# Patient Record
Sex: Female | Born: 1935 | Race: White | Hispanic: No | Marital: Married | Smoking: Never smoker
Health system: Southern US, Community
[De-identification: ages and names within clinical notes are randomized; demographics above are authoritative.]

## PROBLEM LIST (undated history)

## (undated) DIAGNOSIS — M810 Age-related osteoporosis without current pathological fracture: Secondary | ICD-10-CM

## (undated) DIAGNOSIS — M069 Rheumatoid arthritis, unspecified: Secondary | ICD-10-CM

## (undated) DIAGNOSIS — E538 Deficiency of other specified B group vitamins: Secondary | ICD-10-CM

## (undated) DIAGNOSIS — E079 Disorder of thyroid, unspecified: Secondary | ICD-10-CM

## (undated) DIAGNOSIS — F0390 Unspecified dementia without behavioral disturbance: Secondary | ICD-10-CM

## (undated) DIAGNOSIS — I10 Essential (primary) hypertension: Secondary | ICD-10-CM

## (undated) DIAGNOSIS — R7303 Prediabetes: Secondary | ICD-10-CM

## (undated) DIAGNOSIS — E039 Hypothyroidism, unspecified: Secondary | ICD-10-CM

## (undated) DIAGNOSIS — F419 Anxiety disorder, unspecified: Secondary | ICD-10-CM

## (undated) DIAGNOSIS — M1990 Unspecified osteoarthritis, unspecified site: Secondary | ICD-10-CM

---

## 1999-10-25 ENCOUNTER — Encounter: Admission: RE | Admit: 1999-10-25 | Discharge: 1999-10-25 | Payer: Self-pay | Admitting: *Deleted

## 1999-10-25 ENCOUNTER — Encounter: Payer: Self-pay | Admitting: *Deleted

## 1999-11-05 ENCOUNTER — Encounter: Payer: Self-pay | Admitting: *Deleted

## 1999-11-05 ENCOUNTER — Encounter: Admission: RE | Admit: 1999-11-05 | Discharge: 1999-11-05 | Payer: Self-pay | Admitting: *Deleted

## 2000-11-05 ENCOUNTER — Encounter: Admission: RE | Admit: 2000-11-05 | Discharge: 2000-11-05 | Payer: Self-pay | Admitting: Internal Medicine

## 2000-11-05 ENCOUNTER — Encounter: Payer: Self-pay | Admitting: Internal Medicine

## 2001-11-06 ENCOUNTER — Encounter: Payer: Self-pay | Admitting: Internal Medicine

## 2001-11-06 ENCOUNTER — Encounter: Admission: RE | Admit: 2001-11-06 | Discharge: 2001-11-06 | Payer: Self-pay | Admitting: Internal Medicine

## 2002-11-08 ENCOUNTER — Encounter: Payer: Self-pay | Admitting: Internal Medicine

## 2002-11-08 ENCOUNTER — Encounter: Admission: RE | Admit: 2002-11-08 | Discharge: 2002-11-08 | Payer: Self-pay | Admitting: Internal Medicine

## 2003-11-14 ENCOUNTER — Encounter: Admission: RE | Admit: 2003-11-14 | Discharge: 2003-11-14 | Payer: Self-pay | Admitting: Internal Medicine

## 2003-12-12 ENCOUNTER — Ambulatory Visit (HOSPITAL_COMMUNITY): Admission: RE | Admit: 2003-12-12 | Discharge: 2003-12-12 | Payer: Self-pay | Admitting: Gastroenterology

## 2004-11-16 ENCOUNTER — Encounter: Admission: RE | Admit: 2004-11-16 | Discharge: 2004-11-16 | Payer: Self-pay | Admitting: Obstetrics and Gynecology

## 2005-12-06 ENCOUNTER — Encounter: Admission: RE | Admit: 2005-12-06 | Discharge: 2005-12-06 | Payer: Self-pay | Admitting: Internal Medicine

## 2006-11-03 ENCOUNTER — Emergency Department: Payer: Self-pay | Admitting: Emergency Medicine

## 2006-11-16 ENCOUNTER — Other Ambulatory Visit: Payer: Self-pay

## 2006-11-16 ENCOUNTER — Emergency Department: Payer: Self-pay | Admitting: Emergency Medicine

## 2006-12-29 ENCOUNTER — Encounter: Admission: RE | Admit: 2006-12-29 | Discharge: 2006-12-29 | Payer: Self-pay | Admitting: Family Medicine

## 2008-01-19 ENCOUNTER — Encounter: Admission: RE | Admit: 2008-01-19 | Discharge: 2008-01-19 | Payer: Self-pay | Admitting: Family Medicine

## 2009-03-15 ENCOUNTER — Encounter: Admission: RE | Admit: 2009-03-15 | Discharge: 2009-03-15 | Payer: Self-pay | Admitting: Family Medicine

## 2010-03-16 ENCOUNTER — Encounter: Admission: RE | Admit: 2010-03-16 | Discharge: 2010-03-16 | Payer: Self-pay | Admitting: Family Medicine

## 2010-06-25 ENCOUNTER — Encounter: Payer: Self-pay | Admitting: Family Medicine

## 2010-10-19 NOTE — Op Note (Signed)
[name], Dana Carter                          ACCOUNT NO.:  [id number]   MEDICAL RECORD NO.:  [id number]                   PATIENT TYPE:  AMB   LOCATION:  ENDO                                 FACILITY:  [facility]   PHYSICIAN:  Danise Edge, M.D.                DATE OF BIRTH:  Sep 24, 1925   DATE OF PROCEDURE:  12/12/2003  DATE OF DISCHARGE:                                 OPERATIVE REPORT   PROCEDURE:  Screening colonoscopy.   INDICATIONS FOR PROCEDURE:  [name] is a 77 year old female born  04/02/1926.  [name] is scheduled to undergo her first screening  colon with polypectomy to prevent colon cancer.   ENDOSCOPIST:  Danise Edge, M.D.   PREMEDICATION:  Versed 5 mg, Demerol 50 mg.   DESCRIPTION OF PROCEDURE:  After obtaining informed consent, [name] was  placed in the left lateral decubitus position. I administered intravenous  Demerol and intravenous Versed to achieve conscious sedation for the  procedure. The patient's blood pressure, oxygen saturation and cardiac  rhythm were monitored throughout the procedure and documented in the medical  record.   Anal inspection and digital rectal exam were normal. The Olympus adjustable  pediatric colonoscope was introduced into the rectum and advanced to the  cecum. Colonic preparation for the exam today was excellent.   RECTUM:  Normal.   SIGMOID COLON AND DESCENDING COLON:  Left colonic diverticulosis.   SPLENIC FLEXURE:  Normal.   TRANSVERSE COLON:  Normal.   HEPATIC FLEXURE:  Normal.   ASCENDING COLON:  Normal.   CECUM AND ILEOCECAL VALVE:  Normal.   ASSESSMENT:  Normal screening proctocolonoscopy to the cecum.  No endoscopic  evidence for the presence of colorectal neoplasia.                                               Danise Edge, M.D.    MJ/MEDQ  D:  12/12/2003  T:  12/12/2003  Job:  [id number]   cc:   Ike Bene, M.D.  [address]  [place]  [place] [address]  Fax:  [phone]

## 2011-03-04 ENCOUNTER — Other Ambulatory Visit: Payer: Self-pay | Admitting: Family Medicine

## 2011-03-04 DIAGNOSIS — Z1231 Encounter for screening mammogram for malignant neoplasm of breast: Secondary | ICD-10-CM

## 2011-03-18 ENCOUNTER — Ambulatory Visit
Admission: RE | Admit: 2011-03-18 | Discharge: 2011-03-18 | Disposition: A | Discharge status: Home / Self Care | Payer: No Typology Code available for payment source | Source: Ambulatory Visit | Attending: Family Medicine | Admitting: Family Medicine

## 2011-03-18 DIAGNOSIS — Z1231 Encounter for screening mammogram for malignant neoplasm of breast: Secondary | ICD-10-CM

## 2011-06-13 DIAGNOSIS — Z23 Encounter for immunization: Secondary | ICD-10-CM | POA: Diagnosis not present

## 2011-06-24 DIAGNOSIS — E039 Hypothyroidism, unspecified: Secondary | ICD-10-CM | POA: Diagnosis not present

## 2011-06-24 DIAGNOSIS — Z79899 Other long term (current) drug therapy: Secondary | ICD-10-CM | POA: Diagnosis not present

## 2011-06-24 DIAGNOSIS — E7800 Pure hypercholesterolemia, unspecified: Secondary | ICD-10-CM | POA: Diagnosis not present

## 2011-06-25 DIAGNOSIS — H04129 Dry eye syndrome of unspecified lacrimal gland: Secondary | ICD-10-CM | POA: Diagnosis not present

## 2011-07-02 DIAGNOSIS — R413 Other amnesia: Secondary | ICD-10-CM | POA: Diagnosis not present

## 2011-07-02 DIAGNOSIS — R443 Hallucinations, unspecified: Secondary | ICD-10-CM | POA: Diagnosis not present

## 2011-07-02 DIAGNOSIS — E039 Hypothyroidism, unspecified: Secondary | ICD-10-CM | POA: Diagnosis not present

## 2011-07-04 DIAGNOSIS — D485 Neoplasm of uncertain behavior of skin: Secondary | ICD-10-CM | POA: Diagnosis not present

## 2011-07-09 DIAGNOSIS — R443 Hallucinations, unspecified: Secondary | ICD-10-CM | POA: Diagnosis not present

## 2011-07-09 DIAGNOSIS — R413 Other amnesia: Secondary | ICD-10-CM | POA: Diagnosis not present

## 2011-07-18 DIAGNOSIS — H6500 Acute serous otitis media, unspecified ear: Secondary | ICD-10-CM | POA: Diagnosis not present

## 2011-07-23 DIAGNOSIS — Z79899 Other long term (current) drug therapy: Secondary | ICD-10-CM | POA: Diagnosis not present

## 2011-07-30 DIAGNOSIS — R413 Other amnesia: Secondary | ICD-10-CM | POA: Diagnosis not present

## 2011-07-30 DIAGNOSIS — M069 Rheumatoid arthritis, unspecified: Secondary | ICD-10-CM | POA: Diagnosis not present

## 2011-07-30 DIAGNOSIS — M810 Age-related osteoporosis without current pathological fracture: Secondary | ICD-10-CM | POA: Diagnosis not present

## 2011-07-30 DIAGNOSIS — N3943 Post-void dribbling: Secondary | ICD-10-CM | POA: Diagnosis not present

## 2011-08-06 DIAGNOSIS — M810 Age-related osteoporosis without current pathological fracture: Secondary | ICD-10-CM | POA: Diagnosis not present

## 2011-08-27 DIAGNOSIS — F0390 Unspecified dementia without behavioral disturbance: Secondary | ICD-10-CM | POA: Diagnosis not present

## 2011-09-03 DIAGNOSIS — M810 Age-related osteoporosis without current pathological fracture: Secondary | ICD-10-CM | POA: Diagnosis not present

## 2011-09-09 DIAGNOSIS — M810 Age-related osteoporosis without current pathological fracture: Secondary | ICD-10-CM | POA: Diagnosis not present

## 2011-10-03 DIAGNOSIS — R300 Dysuria: Secondary | ICD-10-CM | POA: Diagnosis not present

## 2011-10-09 DIAGNOSIS — N8111 Cystocele, midline: Secondary | ICD-10-CM | POA: Diagnosis not present

## 2011-10-09 DIAGNOSIS — R339 Retention of urine, unspecified: Secondary | ICD-10-CM | POA: Diagnosis not present

## 2011-10-12 ENCOUNTER — Emergency Department: Payer: Self-pay | Admitting: Emergency Medicine

## 2011-10-12 DIAGNOSIS — N8111 Cystocele, midline: Secondary | ICD-10-CM | POA: Diagnosis not present

## 2011-10-12 DIAGNOSIS — R6889 Other general symptoms and signs: Secondary | ICD-10-CM | POA: Diagnosis not present

## 2011-10-12 DIAGNOSIS — Z79899 Other long term (current) drug therapy: Secondary | ICD-10-CM | POA: Diagnosis not present

## 2011-10-12 LAB — URINALYSIS, COMPLETE
Bilirubin,UR: NEGATIVE
Blood: NEGATIVE
Ketone: NEGATIVE
Protein: NEGATIVE
RBC,UR: 1 /HPF (ref 0–5)
Specific Gravity: 1.009 (ref 1.003–1.030)
Squamous Epithelial: 6
Transitional Epi: <1

## 2011-10-17 DIAGNOSIS — N952 Postmenopausal atrophic vaginitis: Secondary | ICD-10-CM | POA: Diagnosis not present

## 2011-10-17 DIAGNOSIS — R339 Retention of urine, unspecified: Secondary | ICD-10-CM | POA: Diagnosis not present

## 2011-10-17 DIAGNOSIS — N8111 Cystocele, midline: Secondary | ICD-10-CM | POA: Diagnosis not present

## 2011-10-24 DIAGNOSIS — N8111 Cystocele, midline: Secondary | ICD-10-CM | POA: Diagnosis not present

## 2011-10-24 DIAGNOSIS — R339 Retention of urine, unspecified: Secondary | ICD-10-CM | POA: Diagnosis not present

## 2011-10-29 DIAGNOSIS — Z79899 Other long term (current) drug therapy: Secondary | ICD-10-CM | POA: Diagnosis not present

## 2011-10-29 DIAGNOSIS — M069 Rheumatoid arthritis, unspecified: Secondary | ICD-10-CM | POA: Diagnosis not present

## 2011-11-05 DIAGNOSIS — R339 Retention of urine, unspecified: Secondary | ICD-10-CM | POA: Diagnosis not present

## 2011-11-05 DIAGNOSIS — N8110 Cystocele, unspecified: Secondary | ICD-10-CM | POA: Diagnosis not present

## 2011-11-21 DIAGNOSIS — M546 Pain in thoracic spine: Secondary | ICD-10-CM | POA: Diagnosis not present

## 2011-11-21 DIAGNOSIS — M549 Dorsalgia, unspecified: Secondary | ICD-10-CM | POA: Diagnosis not present

## 2011-12-10 DIAGNOSIS — Z961 Presence of intraocular lens: Secondary | ICD-10-CM | POA: Diagnosis not present

## 2011-12-11 DIAGNOSIS — J069 Acute upper respiratory infection, unspecified: Secondary | ICD-10-CM | POA: Diagnosis not present

## 2012-01-07 DIAGNOSIS — E7800 Pure hypercholesterolemia, unspecified: Secondary | ICD-10-CM | POA: Diagnosis not present

## 2012-01-07 DIAGNOSIS — E538 Deficiency of other specified B group vitamins: Secondary | ICD-10-CM | POA: Diagnosis not present

## 2012-01-07 DIAGNOSIS — E039 Hypothyroidism, unspecified: Secondary | ICD-10-CM | POA: Diagnosis not present

## 2012-01-09 DIAGNOSIS — M549 Dorsalgia, unspecified: Secondary | ICD-10-CM | POA: Diagnosis not present

## 2012-01-09 DIAGNOSIS — N8110 Cystocele, unspecified: Secondary | ICD-10-CM | POA: Diagnosis not present

## 2012-01-10 DIAGNOSIS — E039 Hypothyroidism, unspecified: Secondary | ICD-10-CM | POA: Diagnosis not present

## 2012-01-10 DIAGNOSIS — I10 Essential (primary) hypertension: Secondary | ICD-10-CM | POA: Diagnosis not present

## 2012-01-10 DIAGNOSIS — R7309 Other abnormal glucose: Secondary | ICD-10-CM | POA: Diagnosis not present

## 2012-01-10 DIAGNOSIS — E7800 Pure hypercholesterolemia, unspecified: Secondary | ICD-10-CM | POA: Diagnosis not present

## 2012-01-21 ENCOUNTER — Ambulatory Visit: Payer: Self-pay | Admitting: Obstetrics and Gynecology

## 2012-01-21 DIAGNOSIS — Z79899 Other long term (current) drug therapy: Secondary | ICD-10-CM | POA: Diagnosis not present

## 2012-01-21 DIAGNOSIS — M069 Rheumatoid arthritis, unspecified: Secondary | ICD-10-CM | POA: Diagnosis not present

## 2012-01-21 DIAGNOSIS — M549 Dorsalgia, unspecified: Secondary | ICD-10-CM | POA: Diagnosis not present

## 2012-01-21 DIAGNOSIS — M5137 Other intervertebral disc degeneration, lumbosacral region: Secondary | ICD-10-CM | POA: Diagnosis not present

## 2012-01-21 DIAGNOSIS — M51379 Other intervertebral disc degeneration, lumbosacral region without mention of lumbar back pain or lower extremity pain: Secondary | ICD-10-CM | POA: Diagnosis not present

## 2012-01-24 DIAGNOSIS — E871 Hypo-osmolality and hyponatremia: Secondary | ICD-10-CM | POA: Diagnosis not present

## 2012-01-28 DIAGNOSIS — M810 Age-related osteoporosis without current pathological fracture: Secondary | ICD-10-CM | POA: Diagnosis not present

## 2012-01-28 DIAGNOSIS — M546 Pain in thoracic spine: Secondary | ICD-10-CM | POA: Diagnosis not present

## 2012-01-28 DIAGNOSIS — M069 Rheumatoid arthritis, unspecified: Secondary | ICD-10-CM | POA: Diagnosis not present

## 2012-02-28 DIAGNOSIS — E871 Hypo-osmolality and hyponatremia: Secondary | ICD-10-CM | POA: Diagnosis not present

## 2012-03-16 DIAGNOSIS — M810 Age-related osteoporosis without current pathological fracture: Secondary | ICD-10-CM | POA: Diagnosis not present

## 2012-03-23 DIAGNOSIS — J309 Allergic rhinitis, unspecified: Secondary | ICD-10-CM | POA: Diagnosis not present

## 2012-03-30 DIAGNOSIS — E871 Hypo-osmolality and hyponatremia: Secondary | ICD-10-CM | POA: Diagnosis not present

## 2012-03-30 DIAGNOSIS — R42 Dizziness and giddiness: Secondary | ICD-10-CM | POA: Diagnosis not present

## 2012-04-03 DIAGNOSIS — R42 Dizziness and giddiness: Secondary | ICD-10-CM | POA: Diagnosis not present

## 2012-04-03 DIAGNOSIS — R209 Unspecified disturbances of skin sensation: Secondary | ICD-10-CM | POA: Diagnosis not present

## 2012-04-21 DIAGNOSIS — M069 Rheumatoid arthritis, unspecified: Secondary | ICD-10-CM | POA: Diagnosis not present

## 2012-04-21 DIAGNOSIS — Z79899 Other long term (current) drug therapy: Secondary | ICD-10-CM | POA: Diagnosis not present

## 2012-06-19 DIAGNOSIS — F411 Generalized anxiety disorder: Secondary | ICD-10-CM | POA: Diagnosis not present

## 2012-06-19 DIAGNOSIS — F0390 Unspecified dementia without behavioral disturbance: Secondary | ICD-10-CM | POA: Diagnosis not present

## 2012-06-19 DIAGNOSIS — E039 Hypothyroidism, unspecified: Secondary | ICD-10-CM | POA: Diagnosis not present

## 2012-06-19 DIAGNOSIS — R634 Abnormal weight loss: Secondary | ICD-10-CM | POA: Diagnosis not present

## 2012-07-15 DIAGNOSIS — E7800 Pure hypercholesterolemia, unspecified: Secondary | ICD-10-CM | POA: Diagnosis not present

## 2012-07-15 DIAGNOSIS — E039 Hypothyroidism, unspecified: Secondary | ICD-10-CM | POA: Diagnosis not present

## 2012-07-15 DIAGNOSIS — E538 Deficiency of other specified B group vitamins: Secondary | ICD-10-CM | POA: Diagnosis not present

## 2012-07-21 DIAGNOSIS — F411 Generalized anxiety disorder: Secondary | ICD-10-CM | POA: Diagnosis not present

## 2012-07-22 DIAGNOSIS — Z79899 Other long term (current) drug therapy: Secondary | ICD-10-CM | POA: Diagnosis not present

## 2012-07-22 DIAGNOSIS — M069 Rheumatoid arthritis, unspecified: Secondary | ICD-10-CM | POA: Diagnosis not present

## 2012-07-29 DIAGNOSIS — M069 Rheumatoid arthritis, unspecified: Secondary | ICD-10-CM | POA: Diagnosis not present

## 2012-08-18 DIAGNOSIS — E7800 Pure hypercholesterolemia, unspecified: Secondary | ICD-10-CM | POA: Diagnosis not present

## 2012-08-18 DIAGNOSIS — F411 Generalized anxiety disorder: Secondary | ICD-10-CM | POA: Diagnosis not present

## 2012-08-18 DIAGNOSIS — J309 Allergic rhinitis, unspecified: Secondary | ICD-10-CM | POA: Diagnosis not present

## 2012-08-18 DIAGNOSIS — IMO0002 Reserved for concepts with insufficient information to code with codable children: Secondary | ICD-10-CM | POA: Diagnosis not present

## 2012-09-01 DIAGNOSIS — IMO0002 Reserved for concepts with insufficient information to code with codable children: Secondary | ICD-10-CM | POA: Diagnosis not present

## 2012-09-01 DIAGNOSIS — L570 Actinic keratosis: Secondary | ICD-10-CM | POA: Diagnosis not present

## 2012-09-01 DIAGNOSIS — D485 Neoplasm of uncertain behavior of skin: Secondary | ICD-10-CM | POA: Diagnosis not present

## 2012-09-16 DIAGNOSIS — M810 Age-related osteoporosis without current pathological fracture: Secondary | ICD-10-CM | POA: Diagnosis not present

## 2012-10-28 DIAGNOSIS — M069 Rheumatoid arthritis, unspecified: Secondary | ICD-10-CM | POA: Diagnosis not present

## 2012-10-30 DIAGNOSIS — H6120 Impacted cerumen, unspecified ear: Secondary | ICD-10-CM | POA: Diagnosis not present

## 2012-10-30 DIAGNOSIS — R42 Dizziness and giddiness: Secondary | ICD-10-CM | POA: Diagnosis not present

## 2012-11-30 DIAGNOSIS — S41109A Unspecified open wound of unspecified upper arm, initial encounter: Secondary | ICD-10-CM | POA: Diagnosis not present

## 2012-12-03 DIAGNOSIS — S41109A Unspecified open wound of unspecified upper arm, initial encounter: Secondary | ICD-10-CM | POA: Diagnosis not present

## 2012-12-21 ENCOUNTER — Emergency Department: Payer: Self-pay | Admitting: Emergency Medicine

## 2012-12-21 DIAGNOSIS — R5381 Other malaise: Secondary | ICD-10-CM | POA: Diagnosis not present

## 2012-12-21 DIAGNOSIS — Z79899 Other long term (current) drug therapy: Secondary | ICD-10-CM | POA: Diagnosis not present

## 2012-12-21 DIAGNOSIS — R42 Dizziness and giddiness: Secondary | ICD-10-CM | POA: Diagnosis not present

## 2012-12-21 DIAGNOSIS — I6350 Cerebral infarction due to unspecified occlusion or stenosis of unspecified cerebral artery: Secondary | ICD-10-CM | POA: Diagnosis not present

## 2012-12-21 DIAGNOSIS — R5383 Other fatigue: Secondary | ICD-10-CM | POA: Diagnosis not present

## 2012-12-21 DIAGNOSIS — I2510 Atherosclerotic heart disease of native coronary artery without angina pectoris: Secondary | ICD-10-CM | POA: Diagnosis not present

## 2012-12-21 DIAGNOSIS — R6889 Other general symptoms and signs: Secondary | ICD-10-CM | POA: Diagnosis not present

## 2012-12-21 DIAGNOSIS — I10 Essential (primary) hypertension: Secondary | ICD-10-CM | POA: Diagnosis not present

## 2012-12-21 LAB — COMPREHENSIVE METABOLIC PANEL
Albumin: 3.2 g/dL — ABNORMAL LOW (ref 3.4–5.0)
Alkaline Phosphatase: 59 U/L (ref 50–136)
BUN: 12 mg/dL (ref 7–18)
Chloride: 103 mmol/L (ref 98–107)
Co2: 28 mmol/L (ref 21–32)
Creatinine: 0.59 mg/dL — ABNORMAL LOW (ref 0.60–1.30)
EGFR (African American): >60
EGFR (Non-African Amer.): >60
Sodium: 135 mmol/L — ABNORMAL LOW (ref 136–145)

## 2012-12-21 LAB — URINALYSIS, COMPLETE
Bilirubin,UR: NEGATIVE
Glucose,UR: NEGATIVE mg/dL (ref 0–75)
Leukocyte Esterase: NEGATIVE
Protein: NEGATIVE
RBC,UR: <1 /HPF (ref 0–5)
Specific Gravity: 1.018 (ref 1.003–1.030)

## 2012-12-21 LAB — CBC
HGB: 12.2 g/dL (ref 12.0–16.0)
MCV: 101 fL — ABNORMAL HIGH (ref 80–100)
Platelet: 184 10*3/uL (ref 150–440)
RDW: 14.4 % (ref 11.5–14.5)

## 2012-12-24 DIAGNOSIS — R42 Dizziness and giddiness: Secondary | ICD-10-CM | POA: Diagnosis not present

## 2012-12-24 DIAGNOSIS — F411 Generalized anxiety disorder: Secondary | ICD-10-CM | POA: Diagnosis not present

## 2012-12-24 DIAGNOSIS — S41109A Unspecified open wound of unspecified upper arm, initial encounter: Secondary | ICD-10-CM | POA: Diagnosis not present

## 2012-12-24 DIAGNOSIS — I10 Essential (primary) hypertension: Secondary | ICD-10-CM | POA: Diagnosis not present

## 2013-01-04 DIAGNOSIS — M069 Rheumatoid arthritis, unspecified: Secondary | ICD-10-CM | POA: Diagnosis not present

## 2013-01-11 DIAGNOSIS — M810 Age-related osteoporosis without current pathological fracture: Secondary | ICD-10-CM | POA: Diagnosis not present

## 2013-01-11 DIAGNOSIS — M069 Rheumatoid arthritis, unspecified: Secondary | ICD-10-CM | POA: Diagnosis not present

## 2013-01-18 DIAGNOSIS — R7309 Other abnormal glucose: Secondary | ICD-10-CM | POA: Diagnosis not present

## 2013-01-18 DIAGNOSIS — E039 Hypothyroidism, unspecified: Secondary | ICD-10-CM | POA: Diagnosis not present

## 2013-01-18 DIAGNOSIS — E7800 Pure hypercholesterolemia, unspecified: Secondary | ICD-10-CM | POA: Diagnosis not present

## 2013-01-18 DIAGNOSIS — E538 Deficiency of other specified B group vitamins: Secondary | ICD-10-CM | POA: Diagnosis not present

## 2013-01-20 DIAGNOSIS — E039 Hypothyroidism, unspecified: Secondary | ICD-10-CM | POA: Diagnosis not present

## 2013-01-20 DIAGNOSIS — R7309 Other abnormal glucose: Secondary | ICD-10-CM | POA: Diagnosis not present

## 2013-01-20 DIAGNOSIS — E7800 Pure hypercholesterolemia, unspecified: Secondary | ICD-10-CM | POA: Diagnosis not present

## 2013-01-20 DIAGNOSIS — E538 Deficiency of other specified B group vitamins: Secondary | ICD-10-CM | POA: Diagnosis not present

## 2013-02-10 DIAGNOSIS — D0460 Carcinoma in situ of skin of unspecified upper limb, including shoulder: Secondary | ICD-10-CM | POA: Diagnosis not present

## 2013-02-10 DIAGNOSIS — C44211 Basal cell carcinoma of skin of unspecified ear and external auricular canal: Secondary | ICD-10-CM | POA: Diagnosis not present

## 2013-02-10 DIAGNOSIS — D485 Neoplasm of uncertain behavior of skin: Secondary | ICD-10-CM | POA: Diagnosis not present

## 2013-02-23 DIAGNOSIS — C44621 Squamous cell carcinoma of skin of unspecified upper limb, including shoulder: Secondary | ICD-10-CM | POA: Diagnosis not present

## 2013-02-23 DIAGNOSIS — L905 Scar conditions and fibrosis of skin: Secondary | ICD-10-CM | POA: Diagnosis not present

## 2013-03-29 DIAGNOSIS — M069 Rheumatoid arthritis, unspecified: Secondary | ICD-10-CM | POA: Diagnosis not present

## 2013-03-29 DIAGNOSIS — Z79899 Other long term (current) drug therapy: Secondary | ICD-10-CM | POA: Diagnosis not present

## 2013-04-15 DIAGNOSIS — M810 Age-related osteoporosis without current pathological fracture: Secondary | ICD-10-CM | POA: Diagnosis not present

## 2013-04-20 DIAGNOSIS — L820 Inflamed seborrheic keratosis: Secondary | ICD-10-CM | POA: Diagnosis not present

## 2013-04-20 DIAGNOSIS — L578 Other skin changes due to chronic exposure to nonionizing radiation: Secondary | ICD-10-CM | POA: Diagnosis not present

## 2013-04-20 DIAGNOSIS — L570 Actinic keratosis: Secondary | ICD-10-CM | POA: Diagnosis not present

## 2013-04-20 DIAGNOSIS — D485 Neoplasm of uncertain behavior of skin: Secondary | ICD-10-CM | POA: Diagnosis not present

## 2013-04-20 DIAGNOSIS — L821 Other seborrheic keratosis: Secondary | ICD-10-CM | POA: Diagnosis not present

## 2013-04-20 DIAGNOSIS — C44721 Squamous cell carcinoma of skin of unspecified lower limb, including hip: Secondary | ICD-10-CM | POA: Diagnosis not present

## 2013-05-11 DIAGNOSIS — C44621 Squamous cell carcinoma of skin of unspecified upper limb, including shoulder: Secondary | ICD-10-CM | POA: Diagnosis not present

## 2013-05-11 DIAGNOSIS — C4492 Squamous cell carcinoma of skin, unspecified: Secondary | ICD-10-CM | POA: Diagnosis not present

## 2013-05-11 DIAGNOSIS — C44711 Basal cell carcinoma of skin of unspecified lower limb, including hip: Secondary | ICD-10-CM | POA: Diagnosis not present

## 2013-06-29 DIAGNOSIS — M069 Rheumatoid arthritis, unspecified: Secondary | ICD-10-CM | POA: Diagnosis not present

## 2013-06-29 DIAGNOSIS — Z79899 Other long term (current) drug therapy: Secondary | ICD-10-CM | POA: Diagnosis not present

## 2013-07-04 ENCOUNTER — Emergency Department: Payer: Self-pay | Admitting: Emergency Medicine

## 2013-07-04 DIAGNOSIS — R221 Localized swelling, mass and lump, neck: Secondary | ICD-10-CM | POA: Diagnosis not present

## 2013-07-04 DIAGNOSIS — S199XXA Unspecified injury of neck, initial encounter: Secondary | ICD-10-CM | POA: Diagnosis not present

## 2013-07-04 DIAGNOSIS — R609 Edema, unspecified: Secondary | ICD-10-CM | POA: Diagnosis not present

## 2013-07-04 DIAGNOSIS — S0993XA Unspecified injury of face, initial encounter: Secondary | ICD-10-CM | POA: Diagnosis not present

## 2013-07-04 DIAGNOSIS — R220 Localized swelling, mass and lump, head: Secondary | ICD-10-CM | POA: Diagnosis not present

## 2013-07-13 DIAGNOSIS — F0390 Unspecified dementia without behavioral disturbance: Secondary | ICD-10-CM | POA: Diagnosis not present

## 2013-07-14 DIAGNOSIS — M810 Age-related osteoporosis without current pathological fracture: Secondary | ICD-10-CM | POA: Diagnosis not present

## 2013-07-14 DIAGNOSIS — M159 Polyosteoarthritis, unspecified: Secondary | ICD-10-CM | POA: Diagnosis not present

## 2013-07-14 DIAGNOSIS — M069 Rheumatoid arthritis, unspecified: Secondary | ICD-10-CM | POA: Diagnosis not present

## 2013-07-15 IMAGING — CR DG LUMBAR SPINE AP/LAT/OBLIQUES W/ FLEX AND EXT
1 series · 5 of 5 positions shown · non-contrast
Comparison: none

REASON FOR EXAM: pain
COMMENTS:

[Series 1: t lumbar spine ap · 0.14mm/px · 5 of 5 slices shown]
[im 1/5]
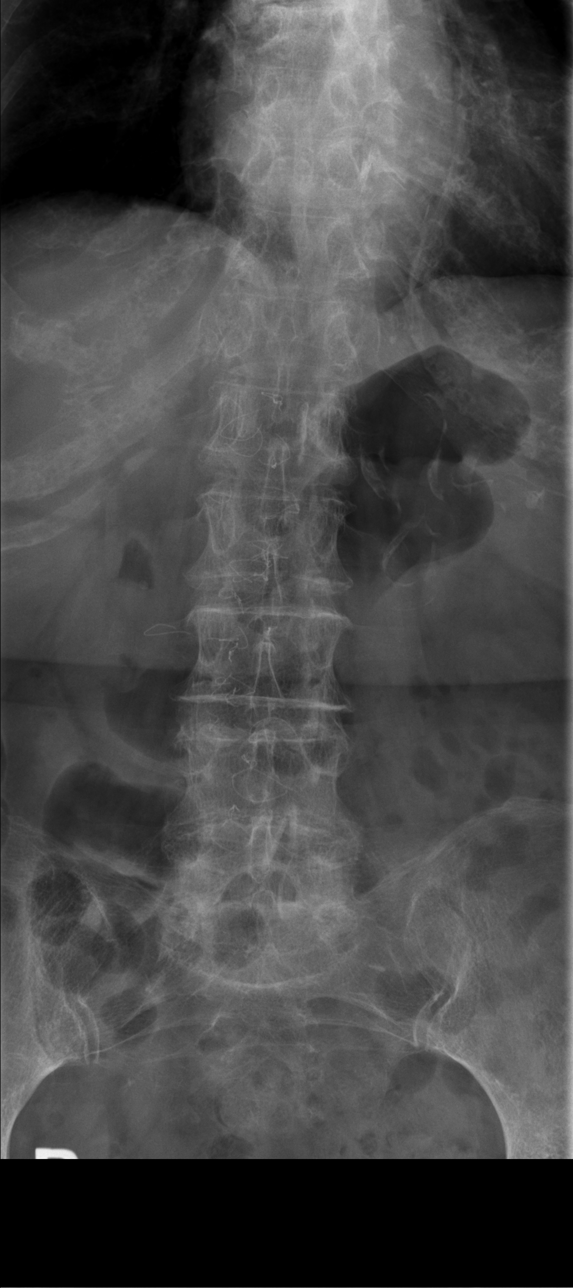
[im 2/5]
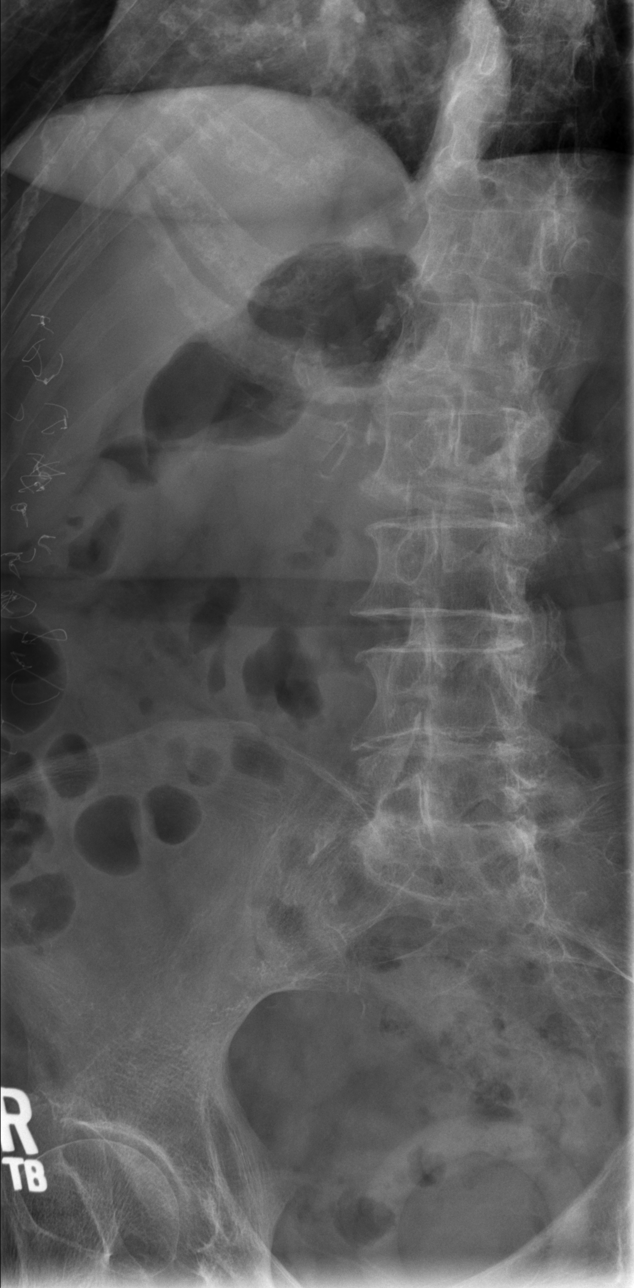
[im 3/5]
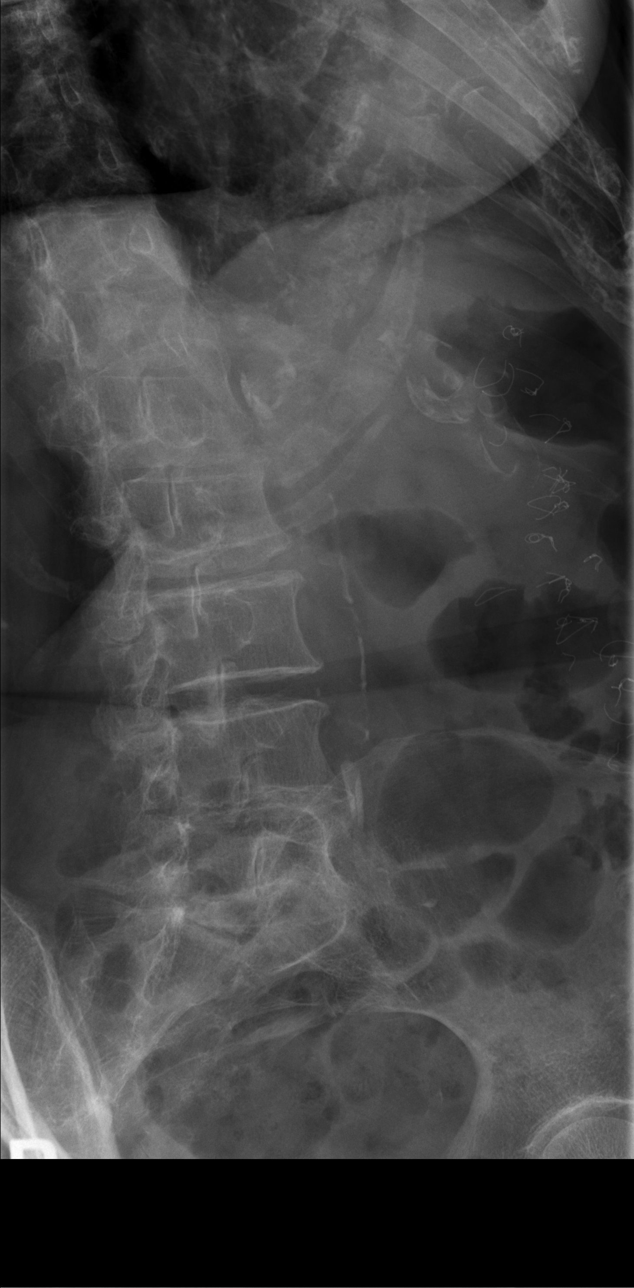
[im 4/5]
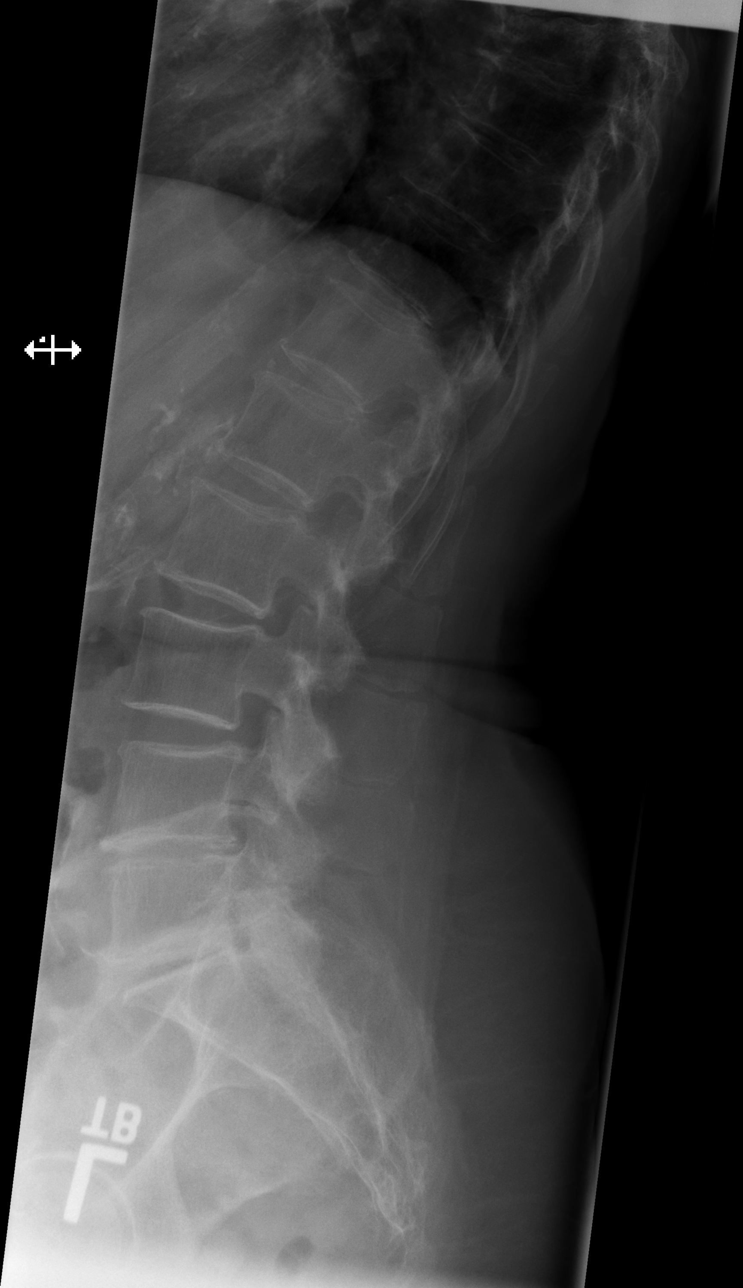
[im 5/5]
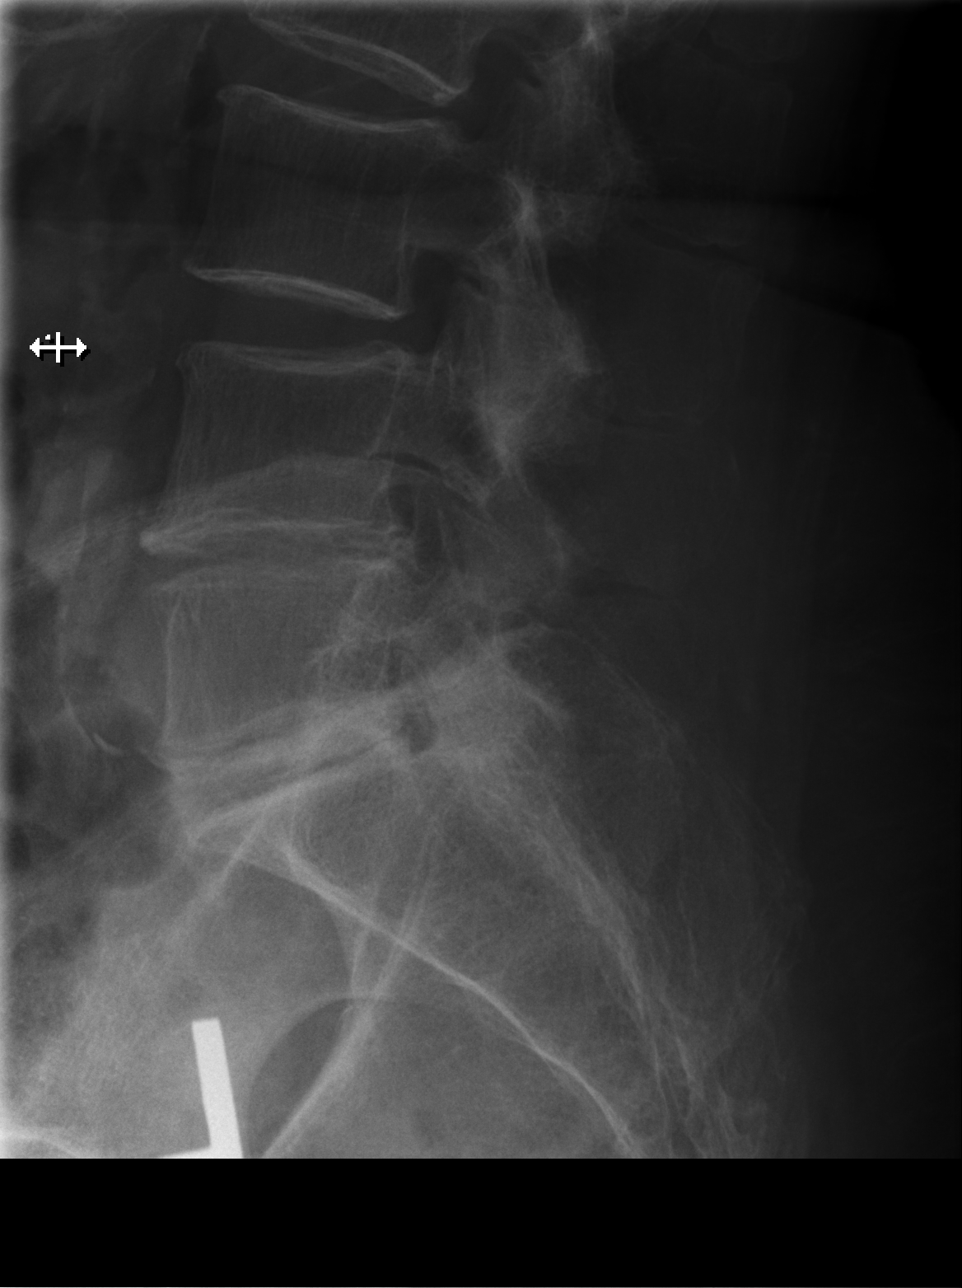

[5 of 5 positions shown; findings below may reference images not displayed]

PROCEDURE:     DXR - DXR LUMBAR SPINE WITH OBLIQUES  - January 21, 2012 [DATE]

RESULT:     The lumbar vertebral bodies are preserved in height. There is
disc space narrowing at L4-L5 and at L5-S1. There is no spondylolisthesis.
The pedicles and transverse processes are grossly intact. The observed
portions of the sacrum are normal.
IMPRESSION: There is degenerative disc space narrowing at L4-L5 and at
L5-S1. There is no evidence of a compression fracture.

[REDACTED]

## 2013-07-15 IMAGING — CR DG THORACIC SPINE 2-3V
1 series · 3 of 3 positions shown · non-contrast
Comparison: none

REASON FOR EXAM: back pain
COMMENTS:

[Series 1: t thoracic spine ap · 0.14mm/px · 3 of 3 slices shown]
[im 1/3]
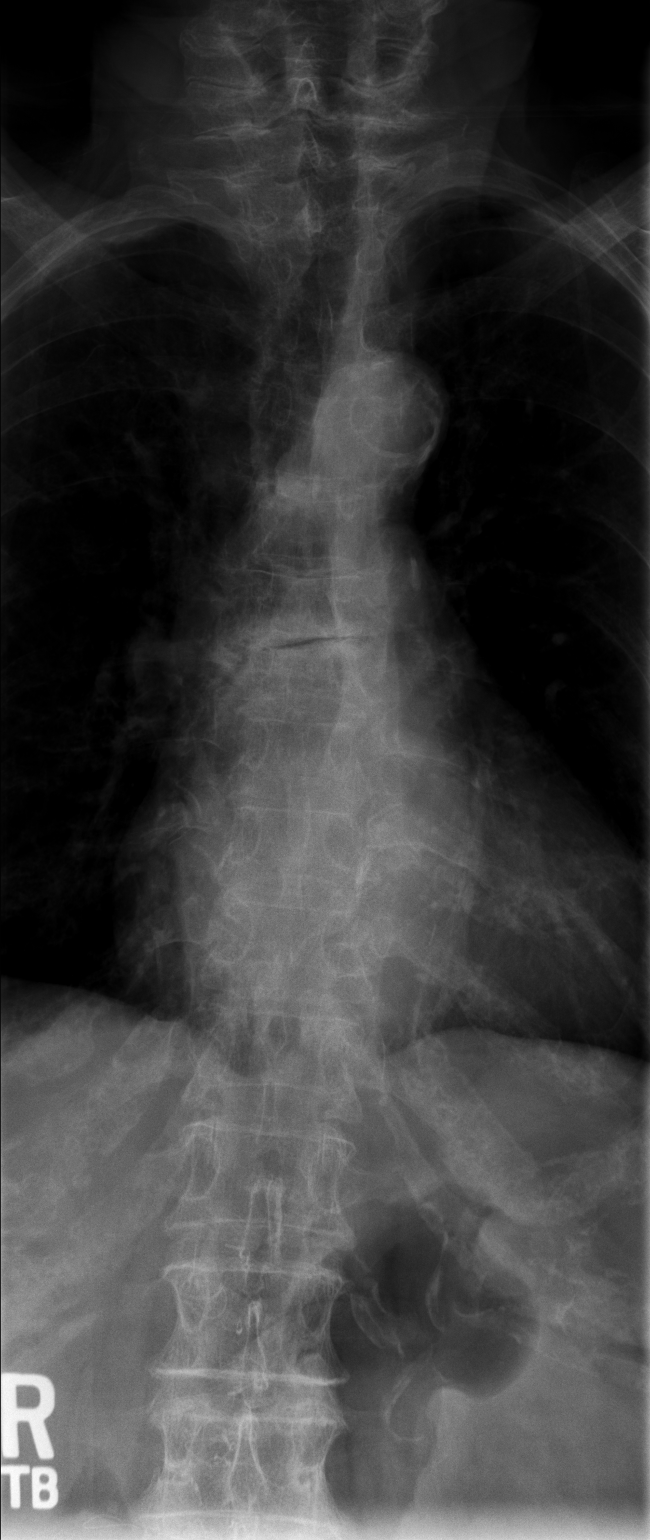
[im 2/3]
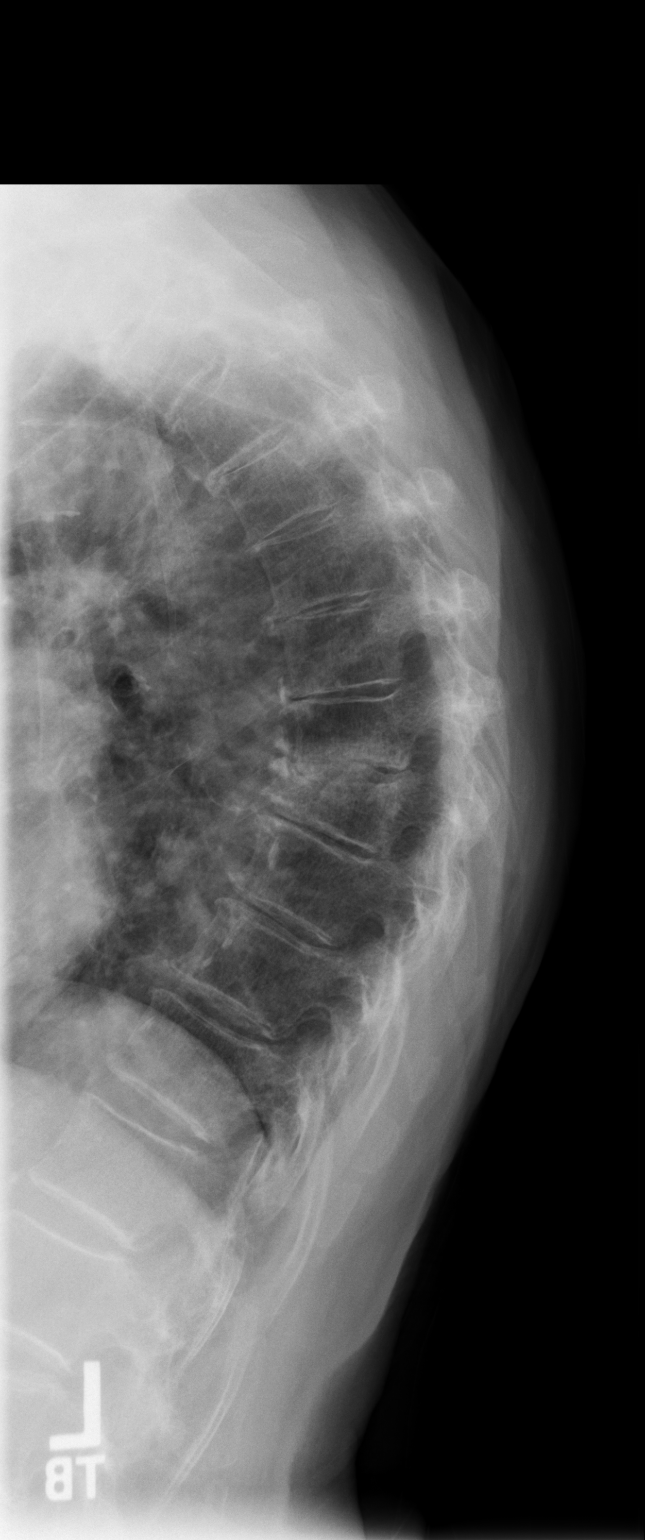
[im 3/3]
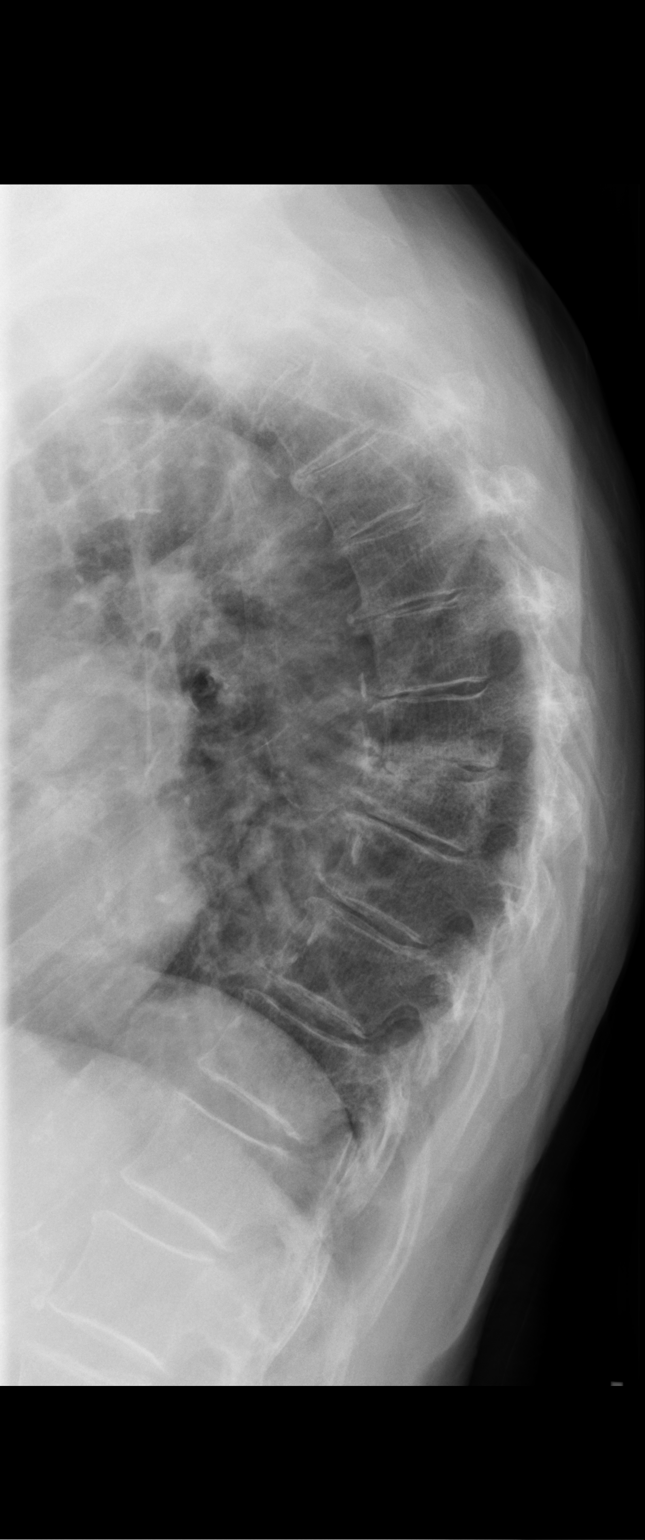

[3 of 3 positions shown; findings below may reference images not displayed]

PROCEDURE:     DXR - DXR THORACIC  AP AND LATERAL  - January 21, 2012 [DATE]

RESULT:     The thoracic vertebral bodies are osteopenic. There are of is
mild wedging of a midthoracic vertebral body with adjacent degenerative
change of the disc and endplate. No retropulsed bony fragments are
demonstrated. No abnormal paravertebral soft tissue densities are evident.
IMPRESSION: There is wedge compression of a midthoracic vertebral
bodies which body which is approximately T8. There are degenerative changes
of the T7-T8 disc. The age of the compression is unclear. It is new since a
lateral chest x-ray 16 November, 2006.

[REDACTED]

## 2013-07-26 DIAGNOSIS — E039 Hypothyroidism, unspecified: Secondary | ICD-10-CM | POA: Diagnosis not present

## 2013-07-26 DIAGNOSIS — R7309 Other abnormal glucose: Secondary | ICD-10-CM | POA: Diagnosis not present

## 2013-07-26 DIAGNOSIS — E538 Deficiency of other specified B group vitamins: Secondary | ICD-10-CM | POA: Diagnosis not present

## 2013-07-28 DIAGNOSIS — F0390 Unspecified dementia without behavioral disturbance: Secondary | ICD-10-CM | POA: Diagnosis not present

## 2013-07-28 DIAGNOSIS — E538 Deficiency of other specified B group vitamins: Secondary | ICD-10-CM | POA: Diagnosis not present

## 2013-07-28 DIAGNOSIS — I10 Essential (primary) hypertension: Secondary | ICD-10-CM | POA: Diagnosis not present

## 2013-07-28 DIAGNOSIS — E039 Hypothyroidism, unspecified: Secondary | ICD-10-CM | POA: Diagnosis not present

## 2013-09-02 DIAGNOSIS — L578 Other skin changes due to chronic exposure to nonionizing radiation: Secondary | ICD-10-CM | POA: Diagnosis not present

## 2013-09-02 DIAGNOSIS — Z85828 Personal history of other malignant neoplasm of skin: Secondary | ICD-10-CM | POA: Diagnosis not present

## 2013-09-02 DIAGNOSIS — D485 Neoplasm of uncertain behavior of skin: Secondary | ICD-10-CM | POA: Diagnosis not present

## 2013-09-02 DIAGNOSIS — C44221 Squamous cell carcinoma of skin of unspecified ear and external auricular canal: Secondary | ICD-10-CM | POA: Diagnosis not present

## 2013-09-02 DIAGNOSIS — C44721 Squamous cell carcinoma of skin of unspecified lower limb, including hip: Secondary | ICD-10-CM | POA: Diagnosis not present

## 2013-09-24 ENCOUNTER — Emergency Department: Payer: Self-pay | Admitting: Emergency Medicine

## 2013-09-24 DIAGNOSIS — N390 Urinary tract infection, site not specified: Secondary | ICD-10-CM | POA: Diagnosis not present

## 2013-09-24 DIAGNOSIS — Z9079 Acquired absence of other genital organ(s): Secondary | ICD-10-CM | POA: Diagnosis not present

## 2013-09-24 DIAGNOSIS — R42 Dizziness and giddiness: Secondary | ICD-10-CM | POA: Diagnosis not present

## 2013-09-24 DIAGNOSIS — Z9089 Acquired absence of other organs: Secondary | ICD-10-CM | POA: Diagnosis not present

## 2013-09-24 DIAGNOSIS — I10 Essential (primary) hypertension: Secondary | ICD-10-CM | POA: Diagnosis not present

## 2013-09-24 LAB — COMPREHENSIVE METABOLIC PANEL
ALK PHOS: 77 U/L
ANION GAP: 5 — AB (ref 7–16)
Albumin: 3.2 g/dL — ABNORMAL LOW (ref 3.4–5.0)
BILIRUBIN TOTAL: 0.3 mg/dL (ref 0.2–1.0)
BUN: 10 mg/dL (ref 7–18)
CALCIUM: 8.7 mg/dL (ref 8.5–10.1)
CHLORIDE: 101 mmol/L (ref 98–107)
CO2: 33 mmol/L — AB (ref 21–32)
CREATININE: 0.7 mg/dL (ref 0.60–1.30)
EGFR (African American): >60
Glucose: 100 mg/dL — ABNORMAL HIGH (ref 65–99)
OSMOLALITY: 277 (ref 275–301)
POTASSIUM: 3.2 mmol/L — AB (ref 3.5–5.1)
SGOT(AST): 18 U/L (ref 15–37)
SGPT (ALT): 12 U/L (ref 12–78)
Sodium: 139 mmol/L (ref 136–145)
TOTAL PROTEIN: 6.4 g/dL (ref 6.4–8.2)

## 2013-09-24 LAB — CBC
HCT: 37.6 % (ref 35.0–47.0)
HGB: 12.7 g/dL (ref 12.0–16.0)
MCH: 32 pg (ref 26.0–34.0)
MCHC: 33.9 g/dL (ref 32.0–36.0)
MCV: 94 fL (ref 80–100)
Platelet: 134 10*3/uL — ABNORMAL LOW (ref 150–440)
RBC: 3.98 10*6/uL (ref 3.80–5.20)
RDW: 14.1 % (ref 11.5–14.5)
WBC: 5.5 10*3/uL (ref 3.6–11.0)

## 2013-09-24 LAB — URINALYSIS, COMPLETE
Bacteria: NONE SEEN
Bilirubin,UR: NEGATIVE
GLUCOSE, UR: NEGATIVE mg/dL (ref 0–75)
Hyaline Cast: 1
Ketone: NEGATIVE
NITRITE: NEGATIVE
PH: 6 (ref 4.5–8.0)
Protein: NEGATIVE
Specific Gravity: 1.009 (ref 1.003–1.030)
WBC UR: 25 /HPF (ref 0–5)

## 2013-09-24 LAB — TROPONIN I

## 2013-09-28 DIAGNOSIS — IMO0002 Reserved for concepts with insufficient information to code with codable children: Secondary | ICD-10-CM | POA: Diagnosis not present

## 2013-09-28 DIAGNOSIS — N390 Urinary tract infection, site not specified: Secondary | ICD-10-CM | POA: Diagnosis not present

## 2013-09-28 DIAGNOSIS — M79609 Pain in unspecified limb: Secondary | ICD-10-CM | POA: Diagnosis not present

## 2013-09-29 ENCOUNTER — Ambulatory Visit: Payer: Self-pay | Admitting: Family Medicine

## 2013-09-29 DIAGNOSIS — IMO0002 Reserved for concepts with insufficient information to code with codable children: Secondary | ICD-10-CM | POA: Diagnosis not present

## 2013-10-05 DIAGNOSIS — N390 Urinary tract infection, site not specified: Secondary | ICD-10-CM | POA: Diagnosis not present

## 2013-10-07 DIAGNOSIS — S62609A Fracture of unspecified phalanx of unspecified finger, initial encounter for closed fracture: Secondary | ICD-10-CM | POA: Diagnosis not present

## 2013-10-18 DIAGNOSIS — M810 Age-related osteoporosis without current pathological fracture: Secondary | ICD-10-CM | POA: Diagnosis not present

## 2013-10-18 DIAGNOSIS — M069 Rheumatoid arthritis, unspecified: Secondary | ICD-10-CM | POA: Diagnosis not present

## 2013-10-20 DIAGNOSIS — M79609 Pain in unspecified limb: Secondary | ICD-10-CM | POA: Diagnosis not present

## 2013-10-20 DIAGNOSIS — IMO0001 Reserved for inherently not codable concepts without codable children: Secondary | ICD-10-CM | POA: Diagnosis not present

## 2013-10-29 DIAGNOSIS — Z111 Encounter for screening for respiratory tuberculosis: Secondary | ICD-10-CM | POA: Diagnosis not present

## 2013-11-02 DIAGNOSIS — D485 Neoplasm of uncertain behavior of skin: Secondary | ICD-10-CM | POA: Diagnosis not present

## 2013-11-02 DIAGNOSIS — C44320 Squamous cell carcinoma of skin of unspecified parts of face: Secondary | ICD-10-CM | POA: Diagnosis not present

## 2013-11-02 DIAGNOSIS — Z85828 Personal history of other malignant neoplasm of skin: Secondary | ICD-10-CM | POA: Diagnosis not present

## 2013-11-02 DIAGNOSIS — L578 Other skin changes due to chronic exposure to nonionizing radiation: Secondary | ICD-10-CM | POA: Diagnosis not present

## 2013-11-02 DIAGNOSIS — L570 Actinic keratosis: Secondary | ICD-10-CM | POA: Diagnosis not present

## 2013-11-18 DIAGNOSIS — M810 Age-related osteoporosis without current pathological fracture: Secondary | ICD-10-CM | POA: Diagnosis not present

## 2014-01-04 DIAGNOSIS — L578 Other skin changes due to chronic exposure to nonionizing radiation: Secondary | ICD-10-CM | POA: Diagnosis not present

## 2014-01-04 DIAGNOSIS — Z85828 Personal history of other malignant neoplasm of skin: Secondary | ICD-10-CM | POA: Diagnosis not present

## 2014-01-04 DIAGNOSIS — D485 Neoplasm of uncertain behavior of skin: Secondary | ICD-10-CM | POA: Diagnosis not present

## 2014-01-04 DIAGNOSIS — L570 Actinic keratosis: Secondary | ICD-10-CM | POA: Diagnosis not present

## 2014-01-28 DIAGNOSIS — E876 Hypokalemia: Secondary | ICD-10-CM | POA: Diagnosis not present

## 2014-01-28 DIAGNOSIS — E538 Deficiency of other specified B group vitamins: Secondary | ICD-10-CM | POA: Diagnosis not present

## 2014-01-28 DIAGNOSIS — E039 Hypothyroidism, unspecified: Secondary | ICD-10-CM | POA: Diagnosis not present

## 2014-01-28 DIAGNOSIS — F0390 Unspecified dementia without behavioral disturbance: Secondary | ICD-10-CM | POA: Diagnosis not present

## 2014-01-28 DIAGNOSIS — H6120 Impacted cerumen, unspecified ear: Secondary | ICD-10-CM | POA: Diagnosis not present

## 2014-01-28 DIAGNOSIS — I10 Essential (primary) hypertension: Secondary | ICD-10-CM | POA: Diagnosis not present

## 2014-03-07 DIAGNOSIS — L570 Actinic keratosis: Secondary | ICD-10-CM | POA: Diagnosis not present

## 2014-03-07 DIAGNOSIS — D485 Neoplasm of uncertain behavior of skin: Secondary | ICD-10-CM | POA: Diagnosis not present

## 2014-03-07 DIAGNOSIS — Z85828 Personal history of other malignant neoplasm of skin: Secondary | ICD-10-CM | POA: Diagnosis not present

## 2014-03-07 DIAGNOSIS — R21 Rash and other nonspecific skin eruption: Secondary | ICD-10-CM | POA: Diagnosis not present

## 2014-03-07 DIAGNOSIS — L578 Other skin changes due to chronic exposure to nonionizing radiation: Secondary | ICD-10-CM | POA: Diagnosis not present

## 2014-03-18 DIAGNOSIS — J069 Acute upper respiratory infection, unspecified: Secondary | ICD-10-CM | POA: Diagnosis not present

## 2014-04-11 DIAGNOSIS — Z961 Presence of intraocular lens: Secondary | ICD-10-CM | POA: Diagnosis not present

## 2014-04-11 DIAGNOSIS — H3531 Nonexudative age-related macular degeneration: Secondary | ICD-10-CM | POA: Diagnosis not present

## 2014-04-19 DIAGNOSIS — M0589 Other rheumatoid arthritis with rheumatoid factor of multiple sites: Secondary | ICD-10-CM | POA: Diagnosis not present

## 2014-04-19 DIAGNOSIS — M810 Age-related osteoporosis without current pathological fracture: Secondary | ICD-10-CM | POA: Diagnosis not present

## 2014-06-15 IMAGING — CT CT HEAD WITHOUT CONTRAST
1 series · 16 of 29 positions shown, 20 images · non-contrast
Comparison: none

REASON FOR EXAM: CVA
COMMENTS:   May transport without cardiac monitor

PROCEDURE:     CT  - CT HEAD WITHOUT CONTRAST  - December 21, 2012 [DATE]
RESULT:     Comparison:  None
TECHNIQUE: Multiple axial images from the foramen magnum to the vertex were
obtained without IV contrast.

[Series 2: soft tissue · axial · 0.40mm/px · z∈[-60,+70]mm · 16 of 29 slices shown, 20 images]
[im 2/29  brain]
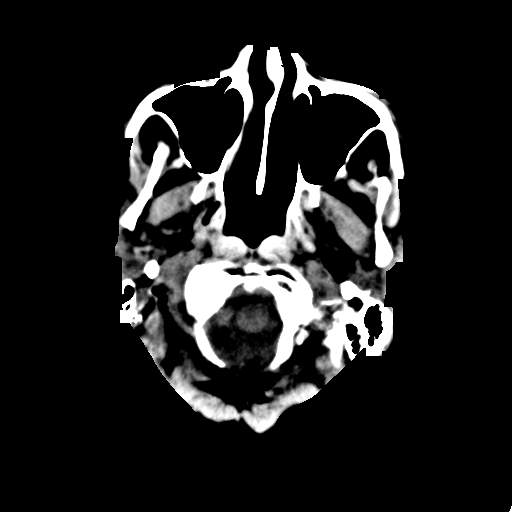
[im 2/29  bone]
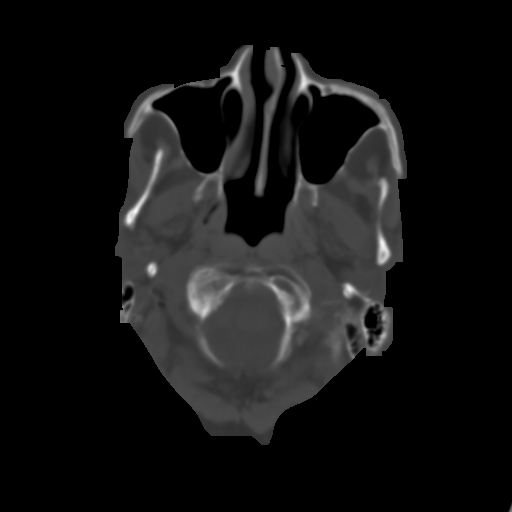
[im 4/29  brain]
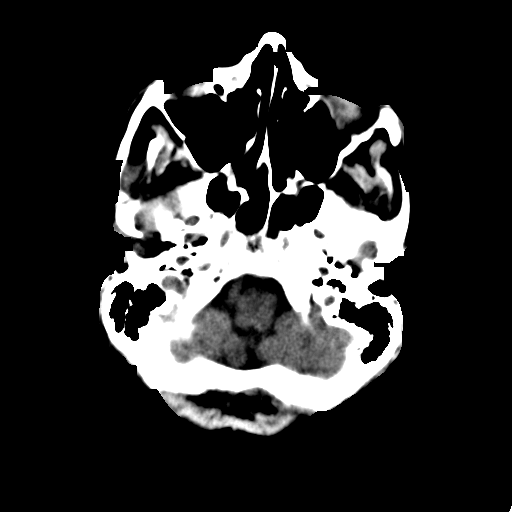
[im 6/29  brain]
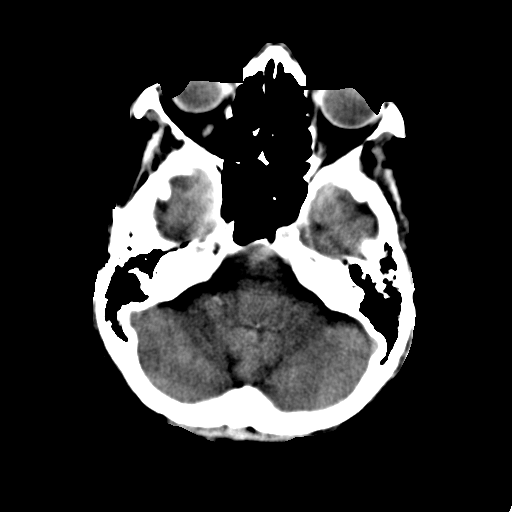
[im 7/29  brain]
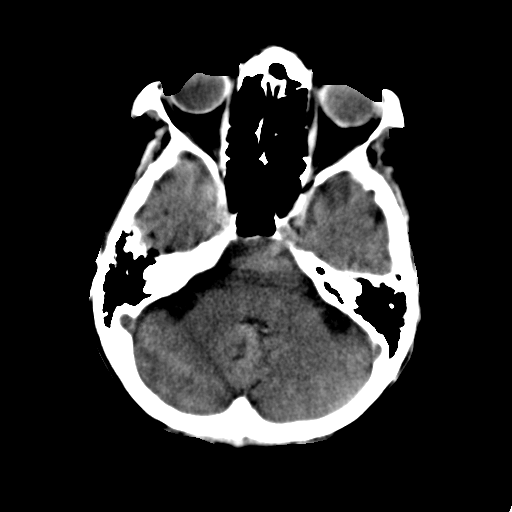
[im 9/29  brain]
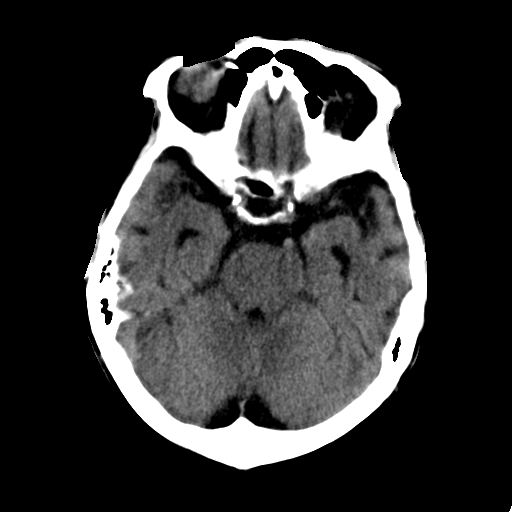
[im 9/29  bone]
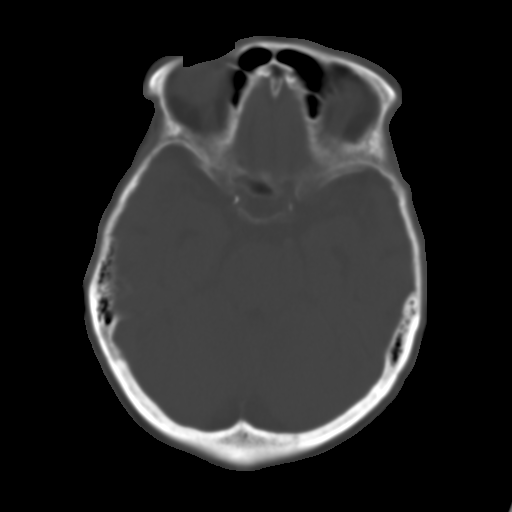
[im 11/29  brain]
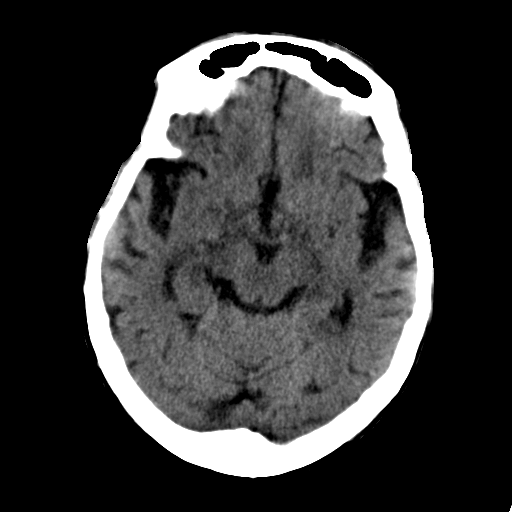
[im 12/29  brain]
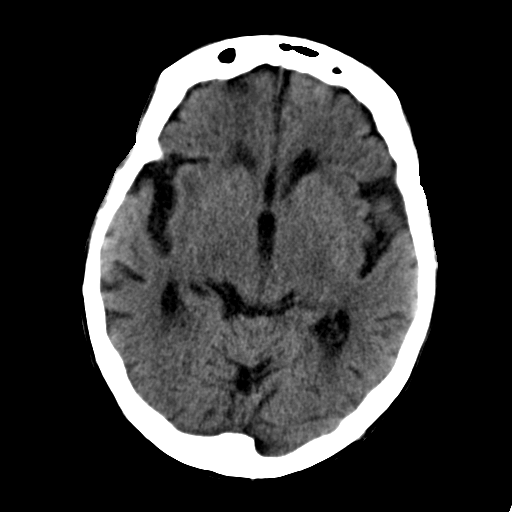
[im 14/29  brain]
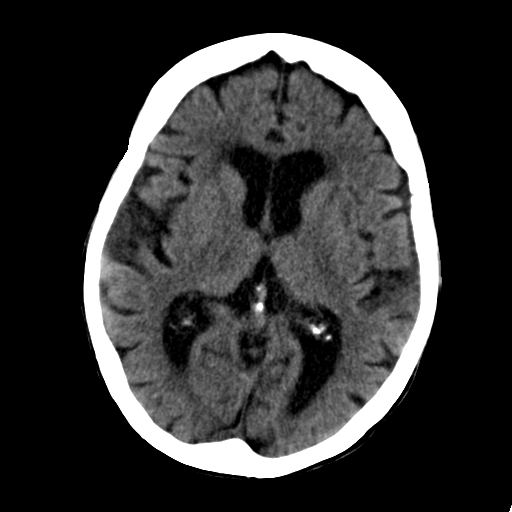
[im 16/29  brain]
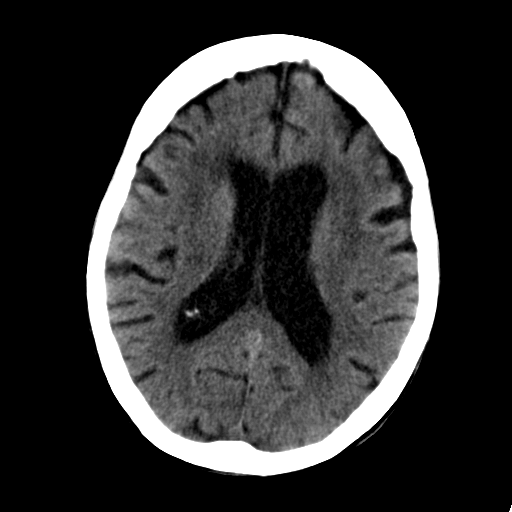
[im 16/29  bone]
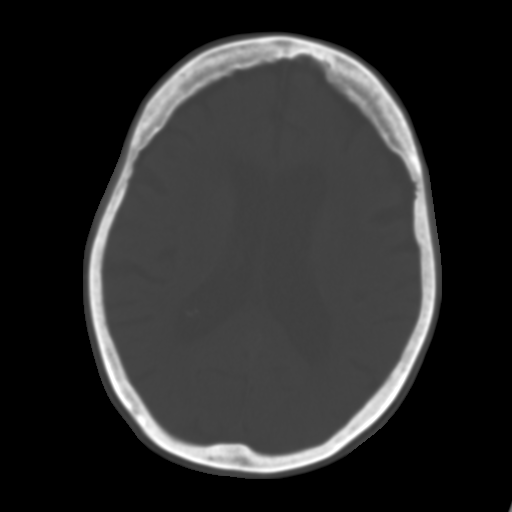
[im 18/29  brain]
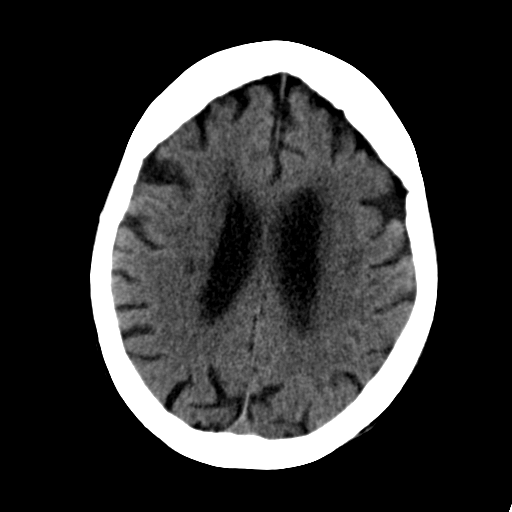
[im 19/29  brain]
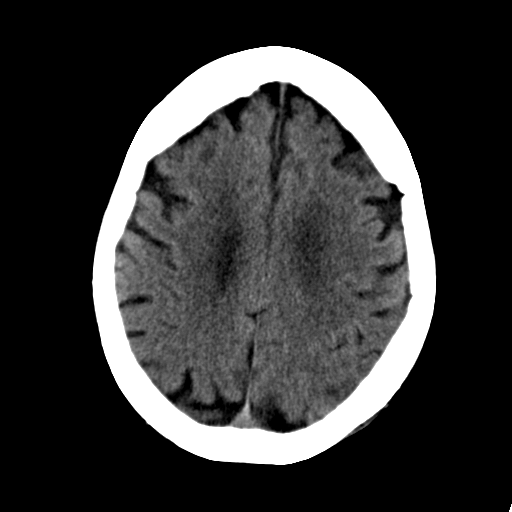
[im 21/29  brain]
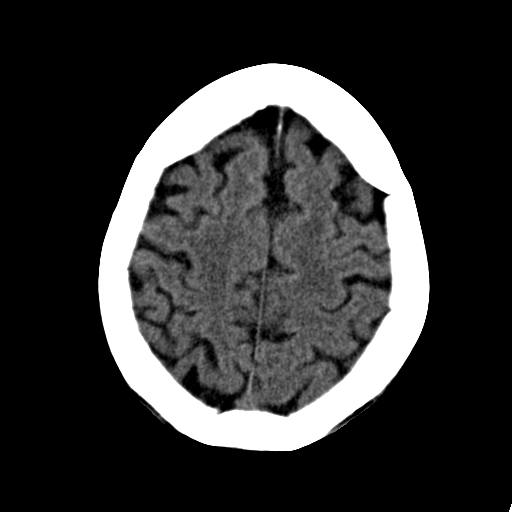
[im 23/29  brain]
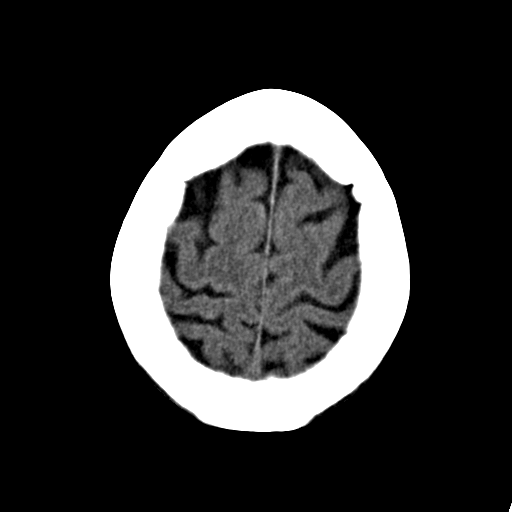
[im 23/29  bone]
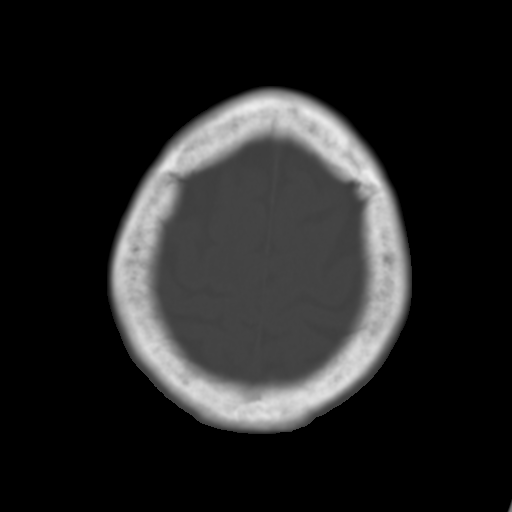
[im 24/29  brain]
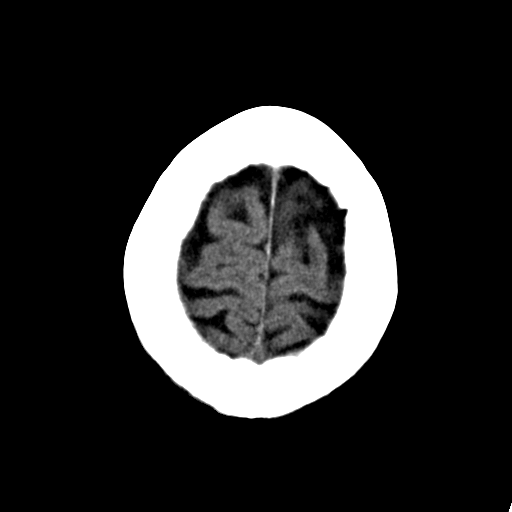
[im 26/29  brain]
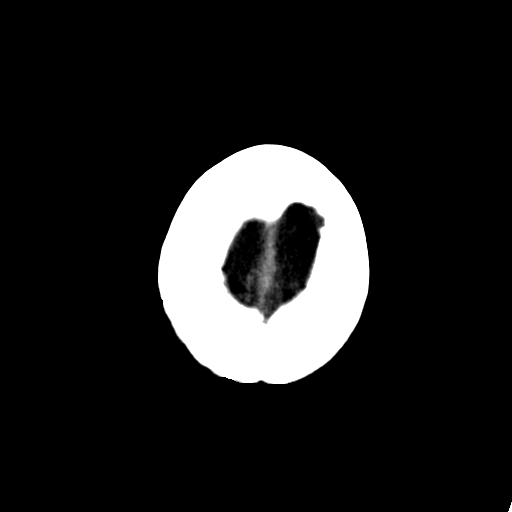
[im 28/29  brain]
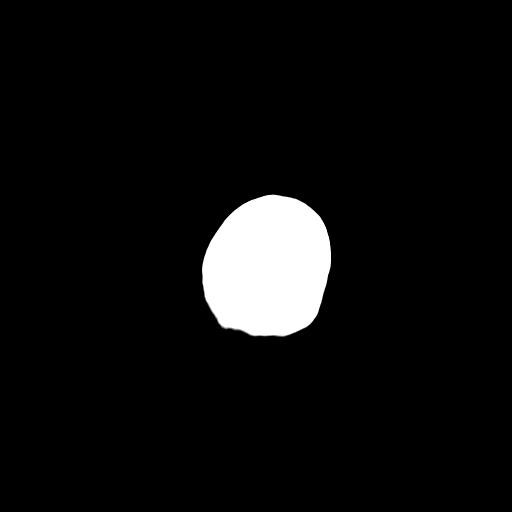

[16 of 29 positions shown; findings below may reference images not displayed]

FINDINGS: There is no evidence of mass effect, midline shift, or extra-axial fluid
collections.  There is no evidence of a space-occupying lesion or
intracranial hemorrhage. There is no evidence of a cortical-based area of
acute infarction. There is generalized cerebral atrophy. There is
periventricular white matter low attenuation likely secondary to
microangiopathy.

The ventricles and sulci are appropriate for the patient's age. The basal
cisterns are patent.

Visualized portions of the orbits are unremarkable. The visualized portions
of the paranasal sinuses and mastoid air cells are unremarkable.
Cerebrovascular atherosclerotic calcifications are noted.

The osseous structures are unremarkable.
IMPRESSION: No acute intracranial process.

[REDACTED]

## 2014-07-05 DIAGNOSIS — Z9181 History of falling: Secondary | ICD-10-CM | POA: Diagnosis not present

## 2014-07-05 DIAGNOSIS — Z1389 Encounter for screening for other disorder: Secondary | ICD-10-CM | POA: Diagnosis not present

## 2014-07-05 DIAGNOSIS — N898 Other specified noninflammatory disorders of vagina: Secondary | ICD-10-CM | POA: Diagnosis not present

## 2014-07-11 DIAGNOSIS — D692 Other nonthrombocytopenic purpura: Secondary | ICD-10-CM | POA: Diagnosis not present

## 2014-07-11 DIAGNOSIS — L578 Other skin changes due to chronic exposure to nonionizing radiation: Secondary | ICD-10-CM | POA: Diagnosis not present

## 2014-07-11 DIAGNOSIS — L570 Actinic keratosis: Secondary | ICD-10-CM | POA: Diagnosis not present

## 2014-07-11 DIAGNOSIS — Z85828 Personal history of other malignant neoplasm of skin: Secondary | ICD-10-CM | POA: Diagnosis not present

## 2014-07-15 DIAGNOSIS — N760 Acute vaginitis: Secondary | ICD-10-CM | POA: Diagnosis not present

## 2014-07-15 DIAGNOSIS — N8111 Cystocele, midline: Secondary | ICD-10-CM | POA: Diagnosis not present

## 2014-08-01 DIAGNOSIS — D485 Neoplasm of uncertain behavior of skin: Secondary | ICD-10-CM | POA: Diagnosis not present

## 2014-08-01 DIAGNOSIS — I10 Essential (primary) hypertension: Secondary | ICD-10-CM | POA: Diagnosis not present

## 2014-08-01 DIAGNOSIS — E876 Hypokalemia: Secondary | ICD-10-CM | POA: Diagnosis not present

## 2014-08-01 DIAGNOSIS — E538 Deficiency of other specified B group vitamins: Secondary | ICD-10-CM | POA: Diagnosis not present

## 2014-08-01 DIAGNOSIS — E039 Hypothyroidism, unspecified: Secondary | ICD-10-CM | POA: Diagnosis not present

## 2014-08-01 DIAGNOSIS — F0390 Unspecified dementia without behavioral disturbance: Secondary | ICD-10-CM | POA: Diagnosis not present

## 2014-08-02 DIAGNOSIS — H3531 Nonexudative age-related macular degeneration: Secondary | ICD-10-CM | POA: Diagnosis not present

## 2014-08-08 DIAGNOSIS — Z23 Encounter for immunization: Secondary | ICD-10-CM | POA: Diagnosis not present

## 2014-08-17 DIAGNOSIS — Z4689 Encounter for fitting and adjustment of other specified devices: Secondary | ICD-10-CM | POA: Diagnosis not present

## 2014-08-17 DIAGNOSIS — F0390 Unspecified dementia without behavioral disturbance: Secondary | ICD-10-CM | POA: Diagnosis not present

## 2014-08-17 DIAGNOSIS — N8111 Cystocele, midline: Secondary | ICD-10-CM | POA: Diagnosis not present

## 2014-10-18 DIAGNOSIS — M810 Age-related osteoporosis without current pathological fracture: Secondary | ICD-10-CM | POA: Diagnosis not present

## 2014-10-18 DIAGNOSIS — M0579 Rheumatoid arthritis with rheumatoid factor of multiple sites without organ or systems involvement: Secondary | ICD-10-CM | POA: Diagnosis not present

## 2014-10-18 DIAGNOSIS — F0390 Unspecified dementia without behavioral disturbance: Secondary | ICD-10-CM | POA: Diagnosis not present

## 2014-10-20 DIAGNOSIS — F419 Anxiety disorder, unspecified: Secondary | ICD-10-CM | POA: Diagnosis not present

## 2014-10-20 DIAGNOSIS — Z1389 Encounter for screening for other disorder: Secondary | ICD-10-CM | POA: Diagnosis not present

## 2014-10-20 DIAGNOSIS — F0390 Unspecified dementia without behavioral disturbance: Secondary | ICD-10-CM | POA: Diagnosis not present

## 2014-10-20 DIAGNOSIS — I10 Essential (primary) hypertension: Secondary | ICD-10-CM | POA: Diagnosis not present

## 2014-10-20 DIAGNOSIS — Z6825 Body mass index (BMI) 25.0-25.9, adult: Secondary | ICD-10-CM | POA: Diagnosis not present

## 2014-11-24 DIAGNOSIS — F0390 Unspecified dementia without behavioral disturbance: Secondary | ICD-10-CM | POA: Diagnosis not present

## 2014-11-24 DIAGNOSIS — Z4689 Encounter for fitting and adjustment of other specified devices: Secondary | ICD-10-CM | POA: Diagnosis not present

## 2014-11-24 DIAGNOSIS — N8111 Cystocele, midline: Secondary | ICD-10-CM | POA: Diagnosis not present

## 2014-12-02 DIAGNOSIS — N3001 Acute cystitis with hematuria: Secondary | ICD-10-CM | POA: Diagnosis not present

## 2014-12-02 DIAGNOSIS — F0390 Unspecified dementia without behavioral disturbance: Secondary | ICD-10-CM | POA: Diagnosis not present

## 2014-12-02 DIAGNOSIS — R531 Weakness: Secondary | ICD-10-CM | POA: Diagnosis not present

## 2014-12-02 DIAGNOSIS — R404 Transient alteration of awareness: Secondary | ICD-10-CM | POA: Diagnosis not present

## 2014-12-02 DIAGNOSIS — N390 Urinary tract infection, site not specified: Secondary | ICD-10-CM | POA: Diagnosis not present

## 2014-12-02 DIAGNOSIS — R918 Other nonspecific abnormal finding of lung field: Secondary | ICD-10-CM | POA: Diagnosis not present

## 2014-12-14 DIAGNOSIS — L570 Actinic keratosis: Secondary | ICD-10-CM | POA: Diagnosis not present

## 2014-12-14 DIAGNOSIS — L578 Other skin changes due to chronic exposure to nonionizing radiation: Secondary | ICD-10-CM | POA: Diagnosis not present

## 2014-12-14 DIAGNOSIS — Z85828 Personal history of other malignant neoplasm of skin: Secondary | ICD-10-CM | POA: Diagnosis not present

## 2015-01-31 DIAGNOSIS — F0390 Unspecified dementia without behavioral disturbance: Secondary | ICD-10-CM | POA: Diagnosis not present

## 2015-01-31 DIAGNOSIS — E039 Hypothyroidism, unspecified: Secondary | ICD-10-CM | POA: Diagnosis not present

## 2015-01-31 DIAGNOSIS — F419 Anxiety disorder, unspecified: Secondary | ICD-10-CM | POA: Diagnosis not present

## 2015-01-31 DIAGNOSIS — E8809 Other disorders of plasma-protein metabolism, not elsewhere classified: Secondary | ICD-10-CM | POA: Diagnosis not present

## 2015-01-31 DIAGNOSIS — Z6824 Body mass index (BMI) 24.0-24.9, adult: Secondary | ICD-10-CM | POA: Diagnosis not present

## 2015-01-31 DIAGNOSIS — E538 Deficiency of other specified B group vitamins: Secondary | ICD-10-CM | POA: Diagnosis not present

## 2015-01-31 DIAGNOSIS — I10 Essential (primary) hypertension: Secondary | ICD-10-CM | POA: Diagnosis not present

## 2015-01-31 DIAGNOSIS — Z1389 Encounter for screening for other disorder: Secondary | ICD-10-CM | POA: Diagnosis not present

## 2015-02-09 DIAGNOSIS — H3531 Nonexudative age-related macular degeneration: Secondary | ICD-10-CM | POA: Diagnosis not present

## 2015-02-24 DIAGNOSIS — Z23 Encounter for immunization: Secondary | ICD-10-CM | POA: Diagnosis not present

## 2015-03-08 DIAGNOSIS — L578 Other skin changes due to chronic exposure to nonionizing radiation: Secondary | ICD-10-CM | POA: Diagnosis not present

## 2015-03-08 DIAGNOSIS — L570 Actinic keratosis: Secondary | ICD-10-CM | POA: Diagnosis not present

## 2015-03-08 DIAGNOSIS — L820 Inflamed seborrheic keratosis: Secondary | ICD-10-CM | POA: Diagnosis not present

## 2015-03-08 DIAGNOSIS — L821 Other seborrheic keratosis: Secondary | ICD-10-CM | POA: Diagnosis not present

## 2015-03-28 DIAGNOSIS — Z4689 Encounter for fitting and adjustment of other specified devices: Secondary | ICD-10-CM | POA: Diagnosis not present

## 2015-03-28 DIAGNOSIS — F0390 Unspecified dementia without behavioral disturbance: Secondary | ICD-10-CM | POA: Diagnosis not present

## 2015-03-28 DIAGNOSIS — N8111 Cystocele, midline: Secondary | ICD-10-CM | POA: Diagnosis not present

## 2015-04-19 DIAGNOSIS — L578 Other skin changes due to chronic exposure to nonionizing radiation: Secondary | ICD-10-CM | POA: Diagnosis not present

## 2015-04-19 DIAGNOSIS — L570 Actinic keratosis: Secondary | ICD-10-CM | POA: Diagnosis not present

## 2015-04-19 DIAGNOSIS — L820 Inflamed seborrheic keratosis: Secondary | ICD-10-CM | POA: Diagnosis not present

## 2015-06-15 DIAGNOSIS — Z6824 Body mass index (BMI) 24.0-24.9, adult: Secondary | ICD-10-CM | POA: Diagnosis not present

## 2015-06-15 DIAGNOSIS — R630 Anorexia: Secondary | ICD-10-CM | POA: Diagnosis not present

## 2015-06-15 DIAGNOSIS — J069 Acute upper respiratory infection, unspecified: Secondary | ICD-10-CM | POA: Diagnosis not present

## 2015-06-15 DIAGNOSIS — Z9181 History of falling: Secondary | ICD-10-CM | POA: Diagnosis not present

## 2015-06-16 DIAGNOSIS — N390 Urinary tract infection, site not specified: Secondary | ICD-10-CM | POA: Diagnosis not present

## 2015-06-19 DIAGNOSIS — N390 Urinary tract infection, site not specified: Secondary | ICD-10-CM | POA: Diagnosis not present

## 2015-06-27 DIAGNOSIS — F0390 Unspecified dementia without behavioral disturbance: Secondary | ICD-10-CM | POA: Diagnosis not present

## 2015-06-27 DIAGNOSIS — M810 Age-related osteoporosis without current pathological fracture: Secondary | ICD-10-CM | POA: Diagnosis not present

## 2015-06-27 DIAGNOSIS — M0579 Rheumatoid arthritis with rheumatoid factor of multiple sites without organ or systems involvement: Secondary | ICD-10-CM | POA: Diagnosis not present

## 2015-07-03 DIAGNOSIS — M810 Age-related osteoporosis without current pathological fracture: Secondary | ICD-10-CM | POA: Diagnosis not present

## 2015-07-20 DIAGNOSIS — L570 Actinic keratosis: Secondary | ICD-10-CM | POA: Diagnosis not present

## 2015-07-20 DIAGNOSIS — L578 Other skin changes due to chronic exposure to nonionizing radiation: Secondary | ICD-10-CM | POA: Diagnosis not present

## 2015-07-20 DIAGNOSIS — Z85828 Personal history of other malignant neoplasm of skin: Secondary | ICD-10-CM | POA: Diagnosis not present

## 2015-07-20 DIAGNOSIS — L820 Inflamed seborrheic keratosis: Secondary | ICD-10-CM | POA: Diagnosis not present

## 2015-07-20 DIAGNOSIS — D485 Neoplasm of uncertain behavior of skin: Secondary | ICD-10-CM | POA: Diagnosis not present

## 2015-07-27 DIAGNOSIS — C44729 Squamous cell carcinoma of skin of left lower limb, including hip: Secondary | ICD-10-CM | POA: Diagnosis not present

## 2015-08-01 DIAGNOSIS — Z6826 Body mass index (BMI) 26.0-26.9, adult: Secondary | ICD-10-CM | POA: Diagnosis not present

## 2015-08-01 DIAGNOSIS — I10 Essential (primary) hypertension: Secondary | ICD-10-CM | POA: Diagnosis not present

## 2015-08-01 DIAGNOSIS — E538 Deficiency of other specified B group vitamins: Secondary | ICD-10-CM | POA: Diagnosis not present

## 2015-08-01 DIAGNOSIS — E778 Other disorders of glycoprotein metabolism: Secondary | ICD-10-CM | POA: Diagnosis not present

## 2015-08-01 DIAGNOSIS — F419 Anxiety disorder, unspecified: Secondary | ICD-10-CM | POA: Diagnosis not present

## 2015-08-01 DIAGNOSIS — F0390 Unspecified dementia without behavioral disturbance: Secondary | ICD-10-CM | POA: Diagnosis not present

## 2015-08-01 DIAGNOSIS — E039 Hypothyroidism, unspecified: Secondary | ICD-10-CM | POA: Diagnosis not present

## 2015-08-03 DIAGNOSIS — Z4689 Encounter for fitting and adjustment of other specified devices: Secondary | ICD-10-CM | POA: Diagnosis not present

## 2015-08-03 DIAGNOSIS — N8111 Cystocele, midline: Secondary | ICD-10-CM | POA: Diagnosis not present

## 2015-08-03 DIAGNOSIS — F0390 Unspecified dementia without behavioral disturbance: Secondary | ICD-10-CM | POA: Diagnosis not present

## 2015-10-17 DIAGNOSIS — F0390 Unspecified dementia without behavioral disturbance: Secondary | ICD-10-CM | POA: Diagnosis not present

## 2015-10-17 DIAGNOSIS — F419 Anxiety disorder, unspecified: Secondary | ICD-10-CM | POA: Diagnosis not present

## 2015-10-17 DIAGNOSIS — I10 Essential (primary) hypertension: Secondary | ICD-10-CM | POA: Diagnosis not present

## 2015-10-26 DIAGNOSIS — N8111 Cystocele, midline: Secondary | ICD-10-CM | POA: Diagnosis not present

## 2015-10-26 DIAGNOSIS — F0390 Unspecified dementia without behavioral disturbance: Secondary | ICD-10-CM | POA: Diagnosis not present

## 2015-10-26 DIAGNOSIS — Z4689 Encounter for fitting and adjustment of other specified devices: Secondary | ICD-10-CM | POA: Diagnosis not present

## 2015-11-01 DIAGNOSIS — R238 Other skin changes: Secondary | ICD-10-CM | POA: Diagnosis not present

## 2015-11-01 DIAGNOSIS — R7303 Prediabetes: Secondary | ICD-10-CM | POA: Diagnosis not present

## 2015-11-01 DIAGNOSIS — M7989 Other specified soft tissue disorders: Secondary | ICD-10-CM | POA: Diagnosis not present

## 2015-11-01 DIAGNOSIS — R000 Tachycardia, unspecified: Secondary | ICD-10-CM | POA: Diagnosis not present

## 2015-11-01 DIAGNOSIS — F0390 Unspecified dementia without behavioral disturbance: Secondary | ICD-10-CM | POA: Diagnosis not present

## 2015-11-01 DIAGNOSIS — M79673 Pain in unspecified foot: Secondary | ICD-10-CM | POA: Diagnosis not present

## 2015-11-01 DIAGNOSIS — M79672 Pain in left foot: Secondary | ICD-10-CM | POA: Diagnosis not present

## 2015-11-01 DIAGNOSIS — L03116 Cellulitis of left lower limb: Secondary | ICD-10-CM | POA: Diagnosis not present

## 2015-11-01 DIAGNOSIS — M810 Age-related osteoporosis without current pathological fracture: Secondary | ICD-10-CM | POA: Diagnosis not present

## 2015-11-01 DIAGNOSIS — Q666 Other congenital valgus deformities of feet: Secondary | ICD-10-CM | POA: Diagnosis not present

## 2015-11-01 DIAGNOSIS — E039 Hypothyroidism, unspecified: Secondary | ICD-10-CM | POA: Diagnosis not present

## 2015-11-03 DIAGNOSIS — L03116 Cellulitis of left lower limb: Secondary | ICD-10-CM | POA: Diagnosis not present

## 2015-11-03 DIAGNOSIS — B372 Candidiasis of skin and nail: Secondary | ICD-10-CM | POA: Diagnosis not present

## 2015-11-03 DIAGNOSIS — Z79899 Other long term (current) drug therapy: Secondary | ICD-10-CM | POA: Diagnosis not present

## 2015-11-03 DIAGNOSIS — Z6826 Body mass index (BMI) 26.0-26.9, adult: Secondary | ICD-10-CM | POA: Diagnosis not present

## 2015-11-03 DIAGNOSIS — L98492 Non-pressure chronic ulcer of skin of other sites with fat layer exposed: Secondary | ICD-10-CM | POA: Diagnosis not present

## 2016-01-09 DIAGNOSIS — F0390 Unspecified dementia without behavioral disturbance: Secondary | ICD-10-CM | POA: Diagnosis not present

## 2016-01-09 DIAGNOSIS — M810 Age-related osteoporosis without current pathological fracture: Secondary | ICD-10-CM | POA: Diagnosis not present

## 2016-01-09 DIAGNOSIS — M0579 Rheumatoid arthritis with rheumatoid factor of multiple sites without organ or systems involvement: Secondary | ICD-10-CM | POA: Diagnosis not present

## 2016-01-15 DIAGNOSIS — Z23 Encounter for immunization: Secondary | ICD-10-CM | POA: Diagnosis not present

## 2016-01-15 DIAGNOSIS — F0390 Unspecified dementia without behavioral disturbance: Secondary | ICD-10-CM | POA: Diagnosis not present

## 2016-01-15 DIAGNOSIS — E039 Hypothyroidism, unspecified: Secondary | ICD-10-CM | POA: Diagnosis not present

## 2016-01-15 DIAGNOSIS — I10 Essential (primary) hypertension: Secondary | ICD-10-CM | POA: Diagnosis not present

## 2016-01-15 DIAGNOSIS — F419 Anxiety disorder, unspecified: Secondary | ICD-10-CM | POA: Diagnosis not present

## 2016-01-15 DIAGNOSIS — Z6826 Body mass index (BMI) 26.0-26.9, adult: Secondary | ICD-10-CM | POA: Diagnosis not present

## 2016-01-15 DIAGNOSIS — Z79899 Other long term (current) drug therapy: Secondary | ICD-10-CM | POA: Diagnosis not present

## 2016-01-15 DIAGNOSIS — E538 Deficiency of other specified B group vitamins: Secondary | ICD-10-CM | POA: Diagnosis not present

## 2016-02-08 DIAGNOSIS — Z961 Presence of intraocular lens: Secondary | ICD-10-CM | POA: Diagnosis not present

## 2016-02-19 DIAGNOSIS — M810 Age-related osteoporosis without current pathological fracture: Secondary | ICD-10-CM | POA: Diagnosis not present

## 2016-02-23 DIAGNOSIS — N8111 Cystocele, midline: Secondary | ICD-10-CM | POA: Diagnosis not present

## 2016-02-23 DIAGNOSIS — N819 Female genital prolapse, unspecified: Secondary | ICD-10-CM | POA: Diagnosis not present

## 2016-03-13 DIAGNOSIS — E039 Hypothyroidism, unspecified: Secondary | ICD-10-CM | POA: Diagnosis not present

## 2016-03-21 DIAGNOSIS — F0390 Unspecified dementia without behavioral disturbance: Secondary | ICD-10-CM | POA: Diagnosis not present

## 2016-03-21 DIAGNOSIS — M810 Age-related osteoporosis without current pathological fracture: Secondary | ICD-10-CM | POA: Diagnosis not present

## 2016-03-21 DIAGNOSIS — E039 Hypothyroidism, unspecified: Secondary | ICD-10-CM | POA: Diagnosis not present

## 2016-03-21 DIAGNOSIS — Z9181 History of falling: Secondary | ICD-10-CM | POA: Diagnosis not present

## 2016-03-21 DIAGNOSIS — Z1389 Encounter for screening for other disorder: Secondary | ICD-10-CM | POA: Diagnosis not present

## 2016-03-21 DIAGNOSIS — F419 Anxiety disorder, unspecified: Secondary | ICD-10-CM | POA: Diagnosis not present

## 2016-03-21 DIAGNOSIS — M069 Rheumatoid arthritis, unspecified: Secondary | ICD-10-CM | POA: Diagnosis not present

## 2016-03-21 DIAGNOSIS — I10 Essential (primary) hypertension: Secondary | ICD-10-CM | POA: Diagnosis not present

## 2016-03-21 DIAGNOSIS — Z6827 Body mass index (BMI) 27.0-27.9, adult: Secondary | ICD-10-CM | POA: Diagnosis not present

## 2016-03-27 DIAGNOSIS — Z23 Encounter for immunization: Secondary | ICD-10-CM | POA: Diagnosis not present

## 2016-05-02 DIAGNOSIS — E039 Hypothyroidism, unspecified: Secondary | ICD-10-CM | POA: Diagnosis not present

## 2016-08-22 DIAGNOSIS — M810 Age-related osteoporosis without current pathological fracture: Secondary | ICD-10-CM | POA: Diagnosis not present

## 2016-08-22 DIAGNOSIS — M0579 Rheumatoid arthritis with rheumatoid factor of multiple sites without organ or systems involvement: Secondary | ICD-10-CM | POA: Diagnosis not present

## 2016-08-22 DIAGNOSIS — F0390 Unspecified dementia without behavioral disturbance: Secondary | ICD-10-CM | POA: Diagnosis not present

## 2016-09-19 DIAGNOSIS — E538 Deficiency of other specified B group vitamins: Secondary | ICD-10-CM | POA: Diagnosis not present

## 2016-09-19 DIAGNOSIS — Z6827 Body mass index (BMI) 27.0-27.9, adult: Secondary | ICD-10-CM | POA: Diagnosis not present

## 2016-09-19 DIAGNOSIS — E039 Hypothyroidism, unspecified: Secondary | ICD-10-CM | POA: Diagnosis not present

## 2016-09-19 DIAGNOSIS — I10 Essential (primary) hypertension: Secondary | ICD-10-CM | POA: Diagnosis not present

## 2016-09-19 DIAGNOSIS — L97519 Non-pressure chronic ulcer of other part of right foot with unspecified severity: Secondary | ICD-10-CM | POA: Diagnosis not present

## 2016-09-19 DIAGNOSIS — M810 Age-related osteoporosis without current pathological fracture: Secondary | ICD-10-CM | POA: Diagnosis not present

## 2017-02-20 DIAGNOSIS — M0579 Rheumatoid arthritis with rheumatoid factor of multiple sites without organ or systems involvement: Secondary | ICD-10-CM | POA: Diagnosis not present

## 2017-02-20 DIAGNOSIS — M810 Age-related osteoporosis without current pathological fracture: Secondary | ICD-10-CM | POA: Diagnosis not present

## 2017-02-20 DIAGNOSIS — F0390 Unspecified dementia without behavioral disturbance: Secondary | ICD-10-CM | POA: Diagnosis not present

## 2017-03-21 DIAGNOSIS — Z6826 Body mass index (BMI) 26.0-26.9, adult: Secondary | ICD-10-CM | POA: Diagnosis not present

## 2017-03-21 DIAGNOSIS — Z23 Encounter for immunization: Secondary | ICD-10-CM | POA: Diagnosis not present

## 2017-03-21 DIAGNOSIS — Z79899 Other long term (current) drug therapy: Secondary | ICD-10-CM | POA: Diagnosis not present

## 2017-03-21 DIAGNOSIS — Z9289 Personal history of other medical treatment: Secondary | ICD-10-CM | POA: Diagnosis not present

## 2017-03-21 DIAGNOSIS — I10 Essential (primary) hypertension: Secondary | ICD-10-CM | POA: Diagnosis not present

## 2017-03-21 DIAGNOSIS — E039 Hypothyroidism, unspecified: Secondary | ICD-10-CM | POA: Diagnosis not present

## 2017-03-21 DIAGNOSIS — M069 Rheumatoid arthritis, unspecified: Secondary | ICD-10-CM | POA: Diagnosis not present

## 2017-03-21 DIAGNOSIS — F0390 Unspecified dementia without behavioral disturbance: Secondary | ICD-10-CM | POA: Diagnosis not present

## 2017-03-28 DIAGNOSIS — Z23 Encounter for immunization: Secondary | ICD-10-CM | POA: Diagnosis not present

## 2017-04-29 DIAGNOSIS — M810 Age-related osteoporosis without current pathological fracture: Secondary | ICD-10-CM | POA: Diagnosis not present

## 2017-06-06 ENCOUNTER — Inpatient Hospital Stay (HOSPITAL_COMMUNITY)
Admission: EM | Admit: 2017-06-06 | Discharge: 2017-06-09 | DRG: 071 | Disposition: A | Discharge status: Nursing Home (Includes ICF) | Payer: Medicare Other | Attending: Internal Medicine | Admitting: Internal Medicine

## 2017-06-06 ENCOUNTER — Encounter (HOSPITAL_COMMUNITY): Payer: Self-pay | Admitting: Nurse Practitioner

## 2017-06-06 ENCOUNTER — Emergency Department (HOSPITAL_COMMUNITY): Payer: Medicare Other

## 2017-06-06 ENCOUNTER — Other Ambulatory Visit: Payer: Self-pay

## 2017-06-06 DIAGNOSIS — G9341 Metabolic encephalopathy: Principal | ICD-10-CM | POA: Diagnosis present

## 2017-06-06 DIAGNOSIS — E876 Hypokalemia: Secondary | ICD-10-CM | POA: Diagnosis not present

## 2017-06-06 DIAGNOSIS — E039 Hypothyroidism, unspecified: Secondary | ICD-10-CM | POA: Diagnosis not present

## 2017-06-06 DIAGNOSIS — D631 Anemia in chronic kidney disease: Secondary | ICD-10-CM | POA: Diagnosis present

## 2017-06-06 DIAGNOSIS — N183 Chronic kidney disease, stage 3 (moderate): Secondary | ICD-10-CM | POA: Diagnosis not present

## 2017-06-06 DIAGNOSIS — F0390 Unspecified dementia without behavioral disturbance: Secondary | ICD-10-CM | POA: Diagnosis present

## 2017-06-06 DIAGNOSIS — E860 Dehydration: Secondary | ICD-10-CM | POA: Diagnosis present

## 2017-06-06 DIAGNOSIS — M069 Rheumatoid arthritis, unspecified: Secondary | ICD-10-CM | POA: Diagnosis present

## 2017-06-06 DIAGNOSIS — R4182 Altered mental status, unspecified: Secondary | ICD-10-CM | POA: Diagnosis not present

## 2017-06-06 DIAGNOSIS — R410 Disorientation, unspecified: Secondary | ICD-10-CM | POA: Diagnosis not present

## 2017-06-06 DIAGNOSIS — D638 Anemia in other chronic diseases classified elsewhere: Secondary | ICD-10-CM | POA: Diagnosis present

## 2017-06-06 DIAGNOSIS — Z882 Allergy status to sulfonamides status: Secondary | ICD-10-CM | POA: Diagnosis not present

## 2017-06-06 DIAGNOSIS — Z66 Do not resuscitate: Secondary | ICD-10-CM | POA: Diagnosis present

## 2017-06-06 DIAGNOSIS — R402441 Other coma, without documented Glasgow coma scale score, or with partial score reported, in the field [EMT or ambulance]: Secondary | ICD-10-CM | POA: Diagnosis not present

## 2017-06-06 DIAGNOSIS — Z886 Allergy status to analgesic agent status: Secondary | ICD-10-CM | POA: Diagnosis not present

## 2017-06-06 DIAGNOSIS — N390 Urinary tract infection, site not specified: Secondary | ICD-10-CM | POA: Diagnosis not present

## 2017-06-06 DIAGNOSIS — N189 Chronic kidney disease, unspecified: Secondary | ICD-10-CM

## 2017-06-06 DIAGNOSIS — R509 Fever, unspecified: Secondary | ICD-10-CM | POA: Diagnosis not present

## 2017-06-06 DIAGNOSIS — D696 Thrombocytopenia, unspecified: Secondary | ICD-10-CM | POA: Diagnosis present

## 2017-06-06 DIAGNOSIS — Z79890 Hormone replacement therapy (postmenopausal): Secondary | ICD-10-CM

## 2017-06-06 DIAGNOSIS — I10 Essential (primary) hypertension: Secondary | ICD-10-CM | POA: Diagnosis present

## 2017-06-06 DIAGNOSIS — R918 Other nonspecific abnormal finding of lung field: Secondary | ICD-10-CM | POA: Diagnosis not present

## 2017-06-06 DIAGNOSIS — Z79899 Other long term (current) drug therapy: Secondary | ICD-10-CM

## 2017-06-06 DIAGNOSIS — Z87440 Personal history of urinary (tract) infections: Secondary | ICD-10-CM | POA: Diagnosis not present

## 2017-06-06 HISTORY — DX: Unspecified dementia, unspecified severity, without behavioral disturbance, psychotic disturbance, mood disturbance, and anxiety: F03.90

## 2017-06-06 HISTORY — DX: Anxiety disorder, unspecified: F41.9

## 2017-06-06 HISTORY — DX: Essential (primary) hypertension: I10

## 2017-06-06 HISTORY — DX: Prediabetes: R73.03

## 2017-06-06 HISTORY — DX: Disorder of thyroid, unspecified: E07.9

## 2017-06-06 HISTORY — DX: Deficiency of other specified B group vitamins: E53.8

## 2017-06-06 HISTORY — DX: Rheumatoid arthritis, unspecified: M06.9

## 2017-06-06 HISTORY — DX: Hypothyroidism, unspecified: E03.9

## 2017-06-06 HISTORY — DX: Age-related osteoporosis without current pathological fracture: M81.0

## 2017-06-06 HISTORY — DX: Unspecified osteoarthritis, unspecified site: M19.90

## 2017-06-06 LAB — COMPREHENSIVE METABOLIC PANEL
ALBUMIN: 3.2 g/dL — AB (ref 3.5–5.0)
ALK PHOS: 68 U/L (ref 38–126)
ALT: 8 U/L — AB (ref 14–54)
AST: 17 U/L (ref 15–41)
Anion gap: 10 (ref 5–15)
BUN: 14 mg/dL (ref 6–20)
CALCIUM: 8.2 mg/dL — AB (ref 8.9–10.3)
CO2: 24 mmol/L (ref 22–32)
CREATININE: 0.72 mg/dL (ref 0.44–1.00)
Chloride: 102 mmol/L (ref 101–111)
GFR calc non Af Amer: >60 mL/min (ref >60)
GLUCOSE: 111 mg/dL — AB (ref 65–99)
Potassium: 3.8 mmol/L (ref 3.5–5.1)
SODIUM: 136 mmol/L (ref 135–145)
Total Bilirubin: 0.6 mg/dL (ref 0.3–1.2)
Total Protein: 6.8 g/dL (ref 6.5–8.1)

## 2017-06-06 LAB — I-STAT CG4 LACTIC ACID, ED
Lactic Acid, Venous: 1.35 mmol/L (ref 0.5–1.9)
Lactic Acid, Venous: 1.12 mmol/L (ref 0.5–1.9)

## 2017-06-06 LAB — CBC WITH DIFFERENTIAL/PLATELET
BASOS PCT: 0 %
Basophils Absolute: 0 10*3/uL (ref 0.0–0.1)
EOS ABS: 0 10*3/uL (ref 0.0–0.7)
EOS PCT: 0 %
HCT: 36.8 % (ref 36.0–46.0)
Hemoglobin: 12 g/dL (ref 12.0–15.0)
Lymphocytes Relative: 7 %
Lymphs Abs: 0.5 10*3/uL — ABNORMAL LOW (ref 0.7–4.0)
MCH: 29.7 pg (ref 26.0–34.0)
MCHC: 32.6 g/dL (ref 30.0–36.0)
MCV: 91.1 fL (ref 78.0–100.0)
MONO ABS: 0.5 10*3/uL (ref 0.1–1.0)
MONOS PCT: 7 %
Neutro Abs: 6.2 10*3/uL (ref 1.7–7.7)
Neutrophils Relative %: 86 %
Platelets: 162 10*3/uL (ref 150–400)
RBC: 4.04 MIL/uL (ref 3.87–5.11)
RDW: 15.3 % (ref 11.5–15.5)
WBC: 7.2 10*3/uL (ref 4.0–10.5)

## 2017-06-06 LAB — URINALYSIS, ROUTINE W REFLEX MICROSCOPIC
Bilirubin Urine: NEGATIVE
Glucose, UA: NEGATIVE mg/dL
Ketones, ur: 5 mg/dL — AB
Nitrite: NEGATIVE
PH: 6 (ref 5.0–8.0)
Protein, ur: 100 mg/dL — AB
SPECIFIC GRAVITY, URINE: 1.019 (ref 1.005–1.030)

## 2017-06-06 LAB — MRSA PCR SCREENING: MRSA by PCR: NEGATIVE

## 2017-06-06 MED ORDER — HYDROXYCHLOROQUINE SULFATE 200 MG PO TABS
200.0000 mg | ORAL_TABLET | Freq: Every day | ORAL | Status: DC
  Administered 2017-06-07 – 2017-06-09 (×3): 200 mg via ORAL
  Filled 2017-06-06 (×4): qty 1

## 2017-06-06 MED ORDER — ACETAMINOPHEN 500 MG PO TABS: 500.0000 mg | ORAL_TABLET | Freq: Four times a day (QID) | ORAL | Status: DC | PRN

## 2017-06-06 MED ORDER — CHLORHEXIDINE GLUCONATE 0.12 % MT SOLN
15.0000 mL | Freq: Two times a day (BID) | OROMUCOSAL | Status: DC
  Administered 2017-06-06 – 2017-06-09 (×6): 15 mL via OROMUCOSAL
  Filled 2017-06-06 (×5): qty 15

## 2017-06-06 MED ORDER — SODIUM CHLORIDE 0.9 % IV SOLN
INTRAVENOUS | Status: DC
  Administered 2017-06-06 – 2017-06-08 (×5): via INTRAVENOUS

## 2017-06-06 MED ORDER — ENOXAPARIN SODIUM 40 MG/0.4ML ~~LOC~~ SOLN
40.0000 mg | SUBCUTANEOUS | Status: DC
  Administered 2017-06-06 – 2017-06-08 (×3): 40 mg via SUBCUTANEOUS
  Filled 2017-06-06 (×3): qty 0.4

## 2017-06-06 MED ORDER — ORAL CARE MOUTH RINSE
15.0000 mL | Freq: Two times a day (BID) | OROMUCOSAL | Status: DC
  Administered 2017-06-06 – 2017-06-08 (×5): 15 mL via OROMUCOSAL

## 2017-06-06 MED ORDER — HYDROCHLOROTHIAZIDE 25 MG PO TABS
25.0000 mg | ORAL_TABLET | Freq: Every day | ORAL | Status: DC
  Administered 2017-06-07: 25 mg via ORAL
  Filled 2017-06-06: qty 1

## 2017-06-06 MED ORDER — SODIUM CHLORIDE 0.9 % IV SOLN
1000.0000 mL | INTRAVENOUS | Status: DC
  Administered 2017-06-06: 1000 mL via INTRAVENOUS

## 2017-06-06 MED ORDER — ONDANSETRON HCL 4 MG/2ML IJ SOLN: 4.0000 mg | Freq: Four times a day (QID) | INTRAMUSCULAR | Status: DC | PRN

## 2017-06-06 MED ORDER — LORATADINE 10 MG PO TABS
10.0000 mg | ORAL_TABLET | Freq: Every day | ORAL | Status: DC
  Administered 2017-06-07 – 2017-06-09 (×3): 10 mg via ORAL
  Filled 2017-06-06 (×3): qty 1

## 2017-06-06 MED ORDER — DEXTROSE 5 % IV SOLN: 1.0000 g | INTRAVENOUS | Status: DC

## 2017-06-06 MED ORDER — GUAIFENESIN-DM 100-10 MG/5ML PO SYRP: 10.0000 mL | ORAL_SOLUTION | Freq: Four times a day (QID) | ORAL | Status: DC | PRN

## 2017-06-06 MED ORDER — ONDANSETRON HCL 4 MG PO TABS: 4.0000 mg | ORAL_TABLET | Freq: Four times a day (QID) | ORAL | Status: DC | PRN

## 2017-06-06 MED ORDER — DONEPEZIL HCL 10 MG PO TABS
10.0000 mg | ORAL_TABLET | Freq: Every day | ORAL | Status: DC
  Administered 2017-06-06 – 2017-06-09 (×4): 10 mg via ORAL
  Filled 2017-06-06 (×4): qty 1

## 2017-06-06 MED ORDER — LEVOTHYROXINE SODIUM 100 MCG PO TABS
100.0000 ug | ORAL_TABLET | Freq: Every day | ORAL | Status: DC
Start: 2017-06-07 — End: 2017-06-10
  Administered 2017-06-07 – 2017-06-09 (×3): 100 ug via ORAL
  Filled 2017-06-06 (×3): qty 1

## 2017-06-06 MED ORDER — ACETAMINOPHEN 325 MG PO TABS
650.0000 mg | ORAL_TABLET | Freq: Once | ORAL | Status: DC
  Filled 2017-06-06: qty 2

## 2017-06-06 MED ORDER — LOPERAMIDE HCL 2 MG PO CAPS: 2.0000 mg | ORAL_CAPSULE | Freq: Two times a day (BID) | ORAL | Status: DC | PRN

## 2017-06-06 MED ORDER — DEXTROSE 5 % IV SOLN
1.0000 g | Freq: Once | INTRAVENOUS | Status: AC
  Administered 2017-06-06: 1 g via INTRAVENOUS
  Filled 2017-06-06: qty 10

## 2017-06-06 MED ORDER — CALCIUM CARBONATE-VITAMIN D 500-200 MG-UNIT PO TABS
1.0000 | ORAL_TABLET | Freq: Every day | ORAL | Status: DC
Start: 2017-06-06 — End: 2017-06-10
  Administered 2017-06-07 – 2017-06-09 (×3): 1 via ORAL
  Filled 2017-06-06 (×3): qty 1

## 2017-06-06 MED ORDER — ACETAMINOPHEN 650 MG RE SUPP
650.0000 mg | Freq: Once | RECTAL | Status: AC
  Administered 2017-06-06: 650 mg via RECTAL
  Filled 2017-06-06: qty 1

## 2017-06-06 MED ORDER — VITAMIN D3 25 MCG (1000 UNIT) PO TABS
1000.0000 [IU] | ORAL_TABLET | Freq: Every day | ORAL | Status: DC
  Administered 2017-06-07 – 2017-06-09 (×3): 1000 [IU] via ORAL
  Filled 2017-06-06 (×3): qty 1

## 2017-06-06 NOTE — H&P (Addendum)
History and Physical    [name] HDQ:[id number] DOB: 11-07-1925 DOA: 06/06/2017  Referring MD/NP/PA: Dr. Theora Gianotti  PCP: Cyndi Bender, PA-C   Patient coming from: ALF  Chief Complaint: change in mental status   HPI: Dana Carter is a 84 y.o. female with medical history significant of HTN, dementia, resident of ALF, presented for evaluation of fevers, chills, change in mental status. Please note that pt is unable to provide any history due to altered mental status and most of the history obtained from available records and ED doctor. Per report, pt has had intermittent chills for 2-3 days, poor oral intake, inability to get out of the bed (at baseline able to bear weight with walker and assistance). In addition, per EMS, oxygen saturation noted to be 88 - 92 % on RA. There was no reports of specific abd concerns, no dyspnea or chest pain reported.   ED Course: In ED, pt was alert but confused, calm, vital signs notable for T 101.7 F, HR up to 117, RR up to 27, BP stable. Blood work unremarkable and stable overall. TRH asked to admit for further evaluation.   Review of Systems:  Unable to obtain due to altered mental status.   Past Medical History:  Diagnosis Date  . Anxiety   . Arthritis   . Dementia   . Hypertension   . Hypothyroid   . hypothyroidsim   . Osteoporosis    hip   . Pre-diabetes   . Rheumatoid arthritis ([place])   . Vitamin B12 deficiency    Social Hx:  reports that  has never smoked. she has never used smokeless tobacco. She reports that she does not drink alcohol or use drugs.  Allergies  Allergen Reactions  . Aspirin Swelling  . Meloxicam Swelling  . Sulfa Antibiotics     Noted from [facility]   Unable to obtain family history due to altered mental status.   Prior to Admission medications   Medication Sig Start Date End Date Taking? Authorizing Provider  donepezil (ARICEPT) 10 MG tablet Take 10 mg by mouth at bedtime.   Yes [provider]    hydrochlorothiazide (HYDRODIURIL) 25 MG tablet Take 25 mg by mouth daily.   Yes [provider]  hydroxychloroquine (PLAQUENIL) 200 MG tablet Take 200 mg by mouth daily.   Yes [provider]  levothyroxine (SYNTHROID, LEVOTHROID) 100 MCG tablet Take 100 mcg by mouth daily before breakfast.   Yes [provider]  potassium chloride SA (K-DUR,KLOR-CON) 20 MEQ tablet Take 10 mEq by mouth daily. Pt takes 1/2 tab daily   Yes [provider]    Physical Exam: Vitals:   06/06/17 0936 06/06/17 0959 06/06/17 1034 06/06/17 1141  BP:  (!) 145/91  136/68  Pulse:  90  (!) 117  Resp:  20  19  Temp:   (!) 101.7 F (38.7 C)   TempSrc:   Rectal   SpO2:  99%  100%  Weight: 60.7 kg (133 lb 12.8 oz)     Height: 4' 10.5" (1.486 m)       Constitutional: NAD, calm, comfortable, confused  Vitals:   06/06/17 0936 06/06/17 0959 06/06/17 1034 06/06/17 1141  BP:  (!) 145/91  136/68  Pulse:  90  (!) 117  Resp:  20  19  Temp:   (!) 101.7 F (38.7 C)   TempSrc:   Rectal   SpO2:  99%  100%  Weight: 60.7 kg (133 lb 12.8 oz)  Height: 4' 10.5" (1.486 m)      Eyes: PERRL, lids and conjunctivae normal ENMT: Mucous membranes are dry. Poor dentition Neck: normal, supple, no masses, no thyromegaly Respiratory: clear to auscultation bilaterally, no wheezing, no crackles.  Cardiovascular: tachycardic, no rubs or gallops and no JVD Abdomen: no tenderness, no masses palpated. No hepatosplenomegaly. Bowel sounds positive.  Musculoskeletal: no clubbing / cyanosis. Skin: SCATTERED ULCERATIVE LESIONS ON BOTH LOWER EXTREMITIES (per daughter, this is baseline)  Neurologic: alert but confused, not able to follow commands, moving all 4 extremities spont Psychiatric: difficult to assess due to altered mental status   Labs on Admission: I have personally reviewed following labs and imaging studies  CBC: Recent Labs  Lab 06/06/17 0951  WBC 7.2  NEUTROABS 6.2  HGB 12.0  HCT  36.8  MCV 91.1  PLT 789   Basic Metabolic Panel: Recent Labs  Lab 06/06/17 0951  NA 136  K 3.8  CL 102  CO2 24  GLUCOSE 111*  BUN 14  CREATININE 0.72  CALCIUM 8.2*   Liver Function Tests: Recent Labs  Lab 06/06/17 0951  AST 17  ALT 8*  ALKPHOS 68  BILITOT 0.6  PROT 6.8  ALBUMIN 3.2*   Urine analysis:    Component Value Date/Time   COLORURINE YELLOW 06/06/2017 1148   APPEARANCEUR HAZY (A) 06/06/2017 1148   APPEARANCEUR Clear 09/24/2013 1105   LABSPEC 1.019 06/06/2017 1148   LABSPEC 1.009 09/24/2013 1105   PHURINE 6.0 06/06/2017 1148   GLUCOSEU NEGATIVE 06/06/2017 1148   GLUCOSEU Negative 09/24/2013 1105   HGBUR SMALL (A) 06/06/2017 1148   BILIRUBINUR NEGATIVE 06/06/2017 1148   BILIRUBINUR Negative 09/24/2013 1105   KETONESUR 5 (A) 06/06/2017 1148   PROTEINUR 100 (A) 06/06/2017 1148   NITRITE NEGATIVE 06/06/2017 1148   LEUKOCYTESUR LARGE (A) 06/06/2017 1148   LEUKOCYTESUR 3+ 09/24/2013 1105   Radiological Exams on Admission: Dg Chest Port 1 View  Result Date: 06/06/2017 CLINICAL DATA:  Fever and wheezing EXAM: PORTABLE CHEST 1 VIEW COMPARISON:  12/02/2014 FINDINGS: 1 cm nodular density right lung base slightly increased in size from the prior study. Subcentimeter nodular density right mid lung appears slightly increased from the prior study. Cardiac enlargement without heart failure. Negative for pneumonia or effusion. Atherosclerotic aorta IMPRESSION: Right lung nodules, appearing slightly larger than the study on 12/02/2014. CT chest recommended for further evaluation. Electronically Signed   By: Franchot Gallo M.D.   On: 06/06/2017 09:59    EKG: pending   Assessment/Plan Active Problems:   Acute metabolic encephalopathy - unclear etiology but suspect UTI imposed on known dementia - will admit for further evaluation - place on rocephin IV - follow up on urine cultures - narrow down ABX as clinically indicated - PT eval once medically more stable      Hypothyroidism - continue synthroid  - check TSH    HTN - continue HCTZ    Dementia  - continue home regimen     RA - continue Plaquenil    LE lesions - per family, this is at baseline   DVT prophylaxis: Lovenox SQ Code Status: DNR Family Communication: Pt and daughter updated at bedside Disposition Plan: back to ALF once medically stable Consults called: none Admission status: inpatient   Faye Ramsay MD [facility] Pager [phone]  If 7PM-7AM, please contact night-coverage [email or web address] Password [email or web address]  06/06/2017, 2:57 PM

## 2017-06-06 NOTE — ED Notes (Signed)
ED TO INPATIENT HANDOFF REPORT  Name/Age/Gender Dana Carter 84 y.o. female  Code Status    Code Status Orders  (From admission, onward)        Start     Ordered   06/06/17 1314  Do not attempt resuscitation (DNR)  Continuous    Question Answer Comment  In the event of cardiac or respiratory ARREST Do not call a "code blue"   In the event of cardiac or respiratory ARREST Do not perform Intubation, CPR, defibrillation or ACLS   In the event of cardiac or respiratory ARREST Use medication by any route, position, wound care, and other measures to relive pain and suffering. May use oxygen, suction and manual treatment of airway obstruction as needed for comfort.      06/06/17 1314    Code Status History    Date Active Date Inactive Code Status Order ID Comments User Context   This patient has a current code status but no historical code status.    Advance Directive Documentation     Most Recent Value  Type of Advance Directive  Out of facility DNR (pink MOST or yellow form)  Pre-existing out of facility DNR order (yellow form or pink MOST form)  Yellow form placed in chart (order not valid for inpatient use)  "MOST" Form in Place?  No data      Home/SNF/Other Nursing Home  Chief Complaint altered mental status/possible uti  Level of Care/Admitting Diagnosis ED Disposition    ED Disposition Condition Comment   Admit  Hospital Area: [facility] [[id number]]  Level of Care: Telemetry [5]  Admit to tele based on following criteria: Complex arrhythmia (Bradycardia/Tachycardia)  Diagnosis: Acute metabolic encephalopathy [7017793]  Admitting Physician: Maryruth Hancock  Attending Physician: Theodis Blaze [[id number]]  Estimated length of stay: 3 - 4 days  Certification:: I certify this patient will need inpatient services for at least 2 midnights  PT Class (Do Not Modify): Inpatient [101]  PT Acc Code (Do Not Modify): Private [1]       Medical  History Past Medical History:  Diagnosis Date  . Anxiety   . Arthritis   . Dementia   . Hypertension   . Hypothyroid   . hypothyroidsim   . Osteoporosis    hip   . Pre-diabetes   . Rheumatoid arthritis ([place])   . Vitamin B12 deficiency     Allergies Allergies  Allergen Reactions  . Aspirin Swelling  . Meloxicam Swelling  . Sulfa Antibiotics     Noted from [facility]    IV Location/Drains/Wounds Patient Lines/Drains/Airways Status   Active Line/Drains/Airways    Name:   Placement date:   Placement time:   Site:   Days:   Peripheral IV 06/06/17 Left;Posterior Forearm   06/06/17    0958    Forearm   less than 1          Labs/Imaging Results for orders placed or performed during the hospital encounter of 06/06/17 (from the past 48 hour(s))  Comprehensive metabolic panel     Status: Abnormal   Collection Time: 06/06/17  9:51 AM  Result Value Ref Range   Sodium 136 135 - 145 mmol/L   Potassium 3.8 3.5 - 5.1 mmol/L   Chloride 102 101 - 111 mmol/L   CO2 24 22 - 32 mmol/L   Glucose, Bld 111 (H) 65 - 99 mg/dL   BUN 14 6 - 20 mg/dL   Creatinine, Ser 0.72 0.44 -  1.00 mg/dL   Calcium 8.2 (L) 8.9 - 10.3 mg/dL   Total Protein 6.8 6.5 - 8.1 g/dL   Albumin 3.2 (L) 3.5 - 5.0 g/dL   AST 17 15 - 41 U/L   ALT 8 (L) 14 - 54 U/L   Alkaline Phosphatase 68 38 - 126 U/L   Total Bilirubin 0.6 0.3 - 1.2 mg/dL   GFR calc non Af Amer >60 >60 mL/min   GFR calc Af Amer >60 >60 mL/min    Comment: (NOTE) The eGFR has been calculated using the CKD EPI equation. This calculation has not been validated in all clinical situations. eGFR's persistently <60 mL/min signify possible Chronic Kidney Disease.    Anion gap 10 5 - 15  CBC WITH DIFFERENTIAL     Status: Abnormal   Collection Time: 06/06/17  9:51 AM  Result Value Ref Range   WBC 7.2 4.0 - 10.5 K/uL   RBC 4.04 3.87 - 5.11 MIL/uL   Hemoglobin 12.0 12.0 - 15.0 g/dL   HCT 36.8 36.0 - 46.0 %   MCV 91.1 78.0 - 100.0 fL   MCH 29.7 26.0 - 34.0  pg   MCHC 32.6 30.0 - 36.0 g/dL   RDW 15.3 11.5 - 15.5 %   Platelets 162 150 - 400 K/uL   Neutrophils Relative % 86 %   Neutro Abs 6.2 1.7 - 7.7 K/uL   Lymphocytes Relative 7 %   Lymphs Abs 0.5 (L) 0.7 - 4.0 K/uL   Monocytes Relative 7 %   Monocytes Absolute 0.5 0.1 - 1.0 K/uL   Eosinophils Relative 0 %   Eosinophils Absolute 0.0 0.0 - 0.7 K/uL   Basophils Relative 0 %   Basophils Absolute 0.0 0.0 - 0.1 K/uL  I-Stat CG4 Lactic Acid, ED  (not at  [facility])     Status: None   Collection Time: 06/06/17 10:11 AM  Result Value Ref Range   Lactic Acid, Venous 1.35 0.5 - 1.9 mmol/L  Urinalysis, Routine w reflex microscopic     Status: Abnormal   Collection Time: 06/06/17 11:48 AM  Result Value Ref Range   Color, Urine YELLOW YELLOW   APPearance HAZY (A) CLEAR   Specific Gravity, Urine 1.019 1.005 - 1.030   pH 6.0 5.0 - 8.0   Glucose, UA NEGATIVE NEGATIVE mg/dL   Hgb urine dipstick SMALL (A) NEGATIVE   Bilirubin Urine NEGATIVE NEGATIVE   Ketones, ur 5 (A) NEGATIVE mg/dL   Protein, ur 100 (A) NEGATIVE mg/dL   Nitrite NEGATIVE NEGATIVE   Leukocytes, UA LARGE (A) NEGATIVE   RBC / HPF TOO NUMEROUS TO COUNT 0 - 5 RBC/hpf   WBC, UA TOO NUMEROUS TO COUNT 0 - 5 WBC/hpf   Bacteria, UA RARE (A) NONE SEEN   Squamous Epithelial / LPF 0-5 (A) NONE SEEN   Mucus PRESENT   I-Stat CG4 Lactic Acid, ED  (not at  [facility])     Status: None   Collection Time: 06/06/17  2:12 PM  Result Value Ref Range   Lactic Acid, Venous 1.12 0.5 - 1.9 mmol/L   Dg Chest Port 1 View  Result Date: 06/06/2017 CLINICAL DATA:  Fever and wheezing EXAM: PORTABLE CHEST 1 VIEW COMPARISON:  12/02/2014 FINDINGS: 1 cm nodular density right lung base slightly increased in size from the prior study. Subcentimeter nodular density right mid lung appears slightly increased from the prior study. Cardiac enlargement without heart failure. Negative for pneumonia or effusion. Atherosclerotic aorta IMPRESSION: Right lung nodules, appearing  slightly  larger than the study on 12/02/2014. CT chest recommended for further evaluation. Electronically Signed   By: Franchot Gallo M.D.   On: 06/06/2017 09:59    Pending Labs Unresulted Labs (From admission, onward)   Start     Ordered   06/06/17 7121  Urine culture  STAT,   STAT     06/06/17 0926   06/06/17 0926  Blood Culture (routine x 2)  BLOOD CULTURE X 2,   STAT     06/06/17 0926   Signed and Held  CBC  (enoxaparin (LOVENOX)    CrCl >/= 30 ml/min)  Once,   R    Comments:  Baseline for enoxaparin therapy IF NOT ALREADY DRAWN.  Notify MD if PLT < 100 K.    Signed and Held   Signed and Held  Creatinine, serum  (enoxaparin (LOVENOX)    CrCl >/= 30 ml/min)  Once,   R    Comments:  Baseline for enoxaparin therapy IF NOT ALREADY DRAWN.    Signed and Held   Signed and Held  Creatinine, serum  (enoxaparin (LOVENOX)    CrCl >/= 30 ml/min)  Weekly,   R    Comments:  while on enoxaparin therapy    Signed and Held   Signed and Held  TSH  Once,   R     Signed and Held   Signed and Held  Urine culture  Once,   R     Signed and Held   Signed and Occupational hygienist morning,   R     Signed and Held   Signed and Held  CBC  Tomorrow morning,   R     Signed and Held      Vitals/Pain Today's Vitals   06/06/17 1330 06/06/17 1400 06/06/17 1500 06/06/17 1525  BP: 107/88 (!) 130/113 114/66   Pulse: 93 97 81   Resp: (!) 22 20 (!) 26   Temp:    98.7 F (37.1 C)  TempSrc:    Axillary  SpO2: 100% 100% 100%   Weight:      Height:        Isolation Precautions No active isolations  Medications Medications  0.9 %  sodium chloride infusion (1,000 mLs Intravenous New Bag/Given 06/06/17 1000)  cefTRIAXone (ROCEPHIN) 1 g in dextrose 5 % 50 mL IVPB (0 g Intravenous Stopped 06/06/17 1500)  acetaminophen (TYLENOL) suppository 650 mg (650 mg Rectal Given 06/06/17 1400)    Mobility non-ambulatory

## 2017-06-06 NOTE — ED Notes (Signed)
Portable CXR completed.

## 2017-06-06 NOTE — ED Notes (Signed)
Status update given to [facility] from caregivers.

## 2017-06-06 NOTE — ED Provider Notes (Signed)
[facility] DEPT Provider Note   CSN: [id number] Arrival date & time: 06/06/17  0626     History   Chief Complaint No chief complaint on file.   HPI Dana Carter is a 84 y.o. female.  Patient is an 84 year old female who lives in assisted living facility presenting today with change in mental status, fevers and chills.  Patient is unable to give any history but paramedics give the history.  Patient for the last 2-3 days has had intermittent fever and chills.  They have not taken her temperature at the facility but she has felt hot.  She was unable to get out of bed and walk today which is unusual for her but facility felt that her mental status was mostly the same.  Paramedics noted patient's oxygen to be between 88-92% and she was placed on oxygen.  She does have a history of UTI but is not currently being treated with antibiotics for any known infection.  It was not reported that the patient had cough, abdominal pain or nausea and vomiting.   The history is provided by the EMS personnel and the nursing home. The history is limited by the absence of a caregiver.    No past medical history on file.  There are no active problems to display for this patient.   History reviewed. No pertinent surgical history.  OB History    No data available       Home Medications    Prior to Admission medications   Medication Sig Start Date End Date Taking? Authorizing Provider  acetaminophen (TYLENOL) 500 MG tablet Take 500 mg by mouth every 6 (six) hours as needed for mild pain or moderate pain.   Yes [provider]  Calcium 600-200 MG-UNIT tablet Take 1 tablet by mouth daily.   Yes [provider]  cetirizine (ZYRTEC) 10 MG tablet Take 10 mg by mouth daily.   Yes [provider]  cholecalciferol (VITAMIN D) 1000 units tablet Take 1,000 Units by mouth daily.   Yes [provider]  donepezil (ARICEPT) 10 MG tablet Take 10  mg by mouth at bedtime.   Yes [provider]  guaiFENesin-dextromethorphan (ROBITUSSIN DM) 100-10 MG/5ML syrup Take 10 mLs by mouth every 6 (six) hours as needed for cough.   Yes [provider]  hydrochlorothiazide (HYDRODIURIL) 25 MG tablet Take 25 mg by mouth daily.   Yes [provider]  hydroxychloroquine (PLAQUENIL) 200 MG tablet Take 200 mg by mouth daily.   Yes [provider]  levothyroxine (SYNTHROID, LEVOTHROID) 100 MCG tablet Take 100 mcg by mouth daily before breakfast.   Yes [provider]  loperamide (IMODIUM) 2 MG capsule Take 2 mg by mouth 2 (two) times daily as needed for diarrhea or loose stools.   Yes [provider]  potassium chloride SA (K-DUR,KLOR-CON) 20 MEQ tablet Take 10 mEq by mouth daily. Pt takes 1/2 tab daily   Yes [provider]  vitamin B-12 (CYANOCOBALAMIN) 1000 MCG tablet Take 500 mcg by mouth daily. Pt takes 1/2 tab daily   Yes [provider]    Family History No family history on file.  Social History Social History   Tobacco Use  . Smoking status: Not on file  Substance Use Topics  . Alcohol use: Not on file  . Drug use: Not on file     Allergies   Patient has no allergy information on record.   Review of Systems Review of  Systems  All other systems reviewed and are negative.    Physical Exam Updated Vital Signs BP 136/68 (BP Location: Left Arm)   Pulse (!) 117   Temp (!) 101.7 F (38.7 C) (Rectal)   Resp 19   Ht 4' 10.5" (1.486 m)   Wt 60.7 kg (133 lb 12.8 oz)   SpO2 100%   BMI 27.49 kg/m   Physical Exam  Constitutional: She appears well-developed and well-nourished. No distress.  HENT:  Head: Normocephalic and atraumatic.  Mouth/Throat: Oropharynx is clear and moist. Mucous membranes are dry.  Eyes: Conjunctivae and EOM are normal. Pupils are equal, round, and reactive to light.  Neck: Normal range of motion. Neck supple.  Cardiovascular: Normal  rate, regular rhythm and intact distal pulses.  No murmur heard. Pulmonary/Chest: Effort normal. No respiratory distress. She has wheezes. She has no rales.  Abdominal: Soft. She exhibits no distension. There is no tenderness. There is no rebound and no guarding.  Musculoskeletal: Normal range of motion. She exhibits no edema or tenderness.  Significant ecchymosis over bilateral lower extremities in various stages of healing  Neurological: She is alert.  Patient is awake and will respond to her name and will follow short commands.  She does not speak or answer questions.  Skin: Skin is warm and dry. Capillary refill takes 2 to 3 seconds. No rash noted. No erythema.  Psychiatric:  Pleasant, cooperative and demented  Nursing note and vitals reviewed.    ED Treatments / Results  Labs (all labs ordered are listed, but only abnormal results are displayed) Labs Reviewed  COMPREHENSIVE METABOLIC PANEL - Abnormal; Notable for the following components:      Result Value   Glucose, Bld 111 (*)    Calcium 8.2 (*)    Albumin 3.2 (*)    ALT 8 (*)    All other components within normal limits  CBC WITH DIFFERENTIAL/PLATELET - Abnormal; Notable for the following components:   Lymphs Abs 0.5 (*)    All other components within normal limits  URINALYSIS, ROUTINE W REFLEX MICROSCOPIC - Abnormal; Notable for the following components:   APPearance HAZY (*)    Hgb urine dipstick SMALL (*)    Ketones, ur 5 (*)    Protein, ur 100 (*)    Leukocytes, UA LARGE (*)    Bacteria, UA RARE (*)    Squamous Epithelial / LPF 0-5 (*)    All other components within normal limits  CULTURE, BLOOD (ROUTINE X 2)  CULTURE, BLOOD (ROUTINE X 2)  URINE CULTURE  I-STAT CG4 LACTIC ACID, ED  I-STAT CG4 LACTIC ACID, ED    EKG  EKG Interpretation  Date/Time:  Friday June 06 2017 10:44:00 EST Ventricular Rate:  83 PR Interval:    QRS Duration: 119 QT Interval:  415 QTC Calculation: 488 R Axis:   -73 Text  Interpretation:  Incomplete analysis due to missing data in precordial lead(s) Sinus rhythm Multiple premature complexes, vent & supraven LVH with IVCD, LAD and secondary repol abnrm Borderline prolonged QT interval Artifact in lead(s) I III aVR aVL Missing lead(s): V5 No significant change since last tracing Confirmed by Blanchie Dessert [phone]) on 06/06/2017 11:30:20 AM       Radiology Dg Chest Port 1 View  Result Date: 06/06/2017 CLINICAL DATA:  Fever and wheezing EXAM: PORTABLE CHEST 1 VIEW COMPARISON:  12/02/2014 FINDINGS: 1 cm nodular density right lung base slightly increased in size from the prior study. Subcentimeter nodular density right mid lung appears slightly  increased from the prior study. Cardiac enlargement without heart failure. Negative for pneumonia or effusion. Atherosclerotic aorta IMPRESSION: Right lung nodules, appearing slightly larger than the study on 12/02/2014. CT chest recommended for further evaluation. Electronically Signed   By: Franchot Gallo M.D.   On: 06/06/2017 09:59    Procedures Procedures (including critical care time)  Medications Ordered in ED Medications  0.9 %  sodium chloride infusion (1,000 mLs Intravenous New Bag/Given 06/06/17 1000)  acetaminophen (TYLENOL) tablet 650 mg (not administered)  cefTRIAXone (ROCEPHIN) 1 g in dextrose 5 % 50 mL IVPB (not administered)     Initial Impression / Assessment and Plan / ED Course  I have reviewed the triage vital signs and the nursing notes.  Pertinent labs & imaging results that were available during my care of the patient were reviewed by me and considered in my medical decision making (see chart for details).     Patient presenting today with concern for possible sepsis.  She has a history of chronic UTIs and dementia but seems that patient was unable to get up and walk today which is her baseline.  Patient has some mild wheezing on exam and paramedics report oxygen saturation between 88 and 92%.   Patient is nontoxic-appearing at this time.  Sepsis order set initiated however will hold on fluid boluses until vital signs are done and lab work has returned.  Low suspicion for cardiac etiology at this time no evidence of fluid overload such as CHF.  Low suspicion for meningitis.  1:00 PM Labs consistent today with UTI with normal lactate and renal function.  Because patient lives in assisted facility and was unable to bed feel that she would benefit from IV antibiotics.  Findings were discussed with the patient's family.  She was started on IV Rocephin.  No prior urine cultures with sensitivities.  Final Clinical Impressions(s) / ED Diagnoses   Final diagnoses:  Urinary tract infection without hematuria, site unspecified  Delirium    ED Discharge Orders    None       Blanchie Dessert, MD 06/06/17 1301

## 2017-06-06 NOTE — Plan of Care (Signed)
  Safety: Ability to remain free from injury will improve 06/06/2017 2343 - Progressing by Mickie Kay, RN

## 2017-06-06 NOTE — ED Triage Notes (Signed)
Pt BIB ems from SNF ([facility]) with c/o possible UTI x2-3 days. Per staff the patient was not acting like herself and normally when she behaves like this she has a UTI. Patient generally will get out of bed and walk with walker and assistance. She does have dementia, however today she was not willing to participate in her normal ADLs.  Patient will follow commands. VS: 140/90,84, 90% patient started on 2L/[place], 18, NSR CBG 136,

## 2017-06-06 NOTE — ED Notes (Signed)
Multiples attempts to perform I&O cath. No success. Purewick in place. Will re-evaluate to see if patient is a little more calmer and cooperative.

## 2017-06-07 DIAGNOSIS — R410 Disorientation, unspecified: Secondary | ICD-10-CM

## 2017-06-07 DIAGNOSIS — E876 Hypokalemia: Secondary | ICD-10-CM | POA: Diagnosis present

## 2017-06-07 DIAGNOSIS — N390 Urinary tract infection, site not specified: Secondary | ICD-10-CM | POA: Diagnosis present

## 2017-06-07 DIAGNOSIS — D696 Thrombocytopenia, unspecified: Secondary | ICD-10-CM | POA: Diagnosis present

## 2017-06-07 DIAGNOSIS — N189 Chronic kidney disease, unspecified: Secondary | ICD-10-CM

## 2017-06-07 DIAGNOSIS — D631 Anemia in chronic kidney disease: Secondary | ICD-10-CM | POA: Diagnosis present

## 2017-06-07 DIAGNOSIS — G9341 Metabolic encephalopathy: Principal | ICD-10-CM

## 2017-06-07 DIAGNOSIS — E039 Hypothyroidism, unspecified: Secondary | ICD-10-CM | POA: Diagnosis present

## 2017-06-07 LAB — BLOOD CULTURE ID PANEL (REFLEXED)
ACINETOBACTER BAUMANNII: NOT DETECTED
CANDIDA ALBICANS: NOT DETECTED
CANDIDA GLABRATA: NOT DETECTED
CANDIDA PARAPSILOSIS: NOT DETECTED
CANDIDA TROPICALIS: NOT DETECTED
Candida krusei: NOT DETECTED
ENTEROBACTER CLOACAE COMPLEX: NOT DETECTED
ENTEROBACTERIACEAE SPECIES: NOT DETECTED
Enterococcus species: NOT DETECTED
Escherichia coli: NOT DETECTED
HAEMOPHILUS INFLUENZAE: NOT DETECTED
KLEBSIELLA OXYTOCA: NOT DETECTED
KLEBSIELLA PNEUMONIAE: NOT DETECTED
Listeria monocytogenes: NOT DETECTED
Neisseria meningitidis: NOT DETECTED
PROTEUS SPECIES: NOT DETECTED
Pseudomonas aeruginosa: NOT DETECTED
STAPHYLOCOCCUS SPECIES: NOT DETECTED
Serratia marcescens: NOT DETECTED
Staphylococcus aureus (BCID): NOT DETECTED
Streptococcus agalactiae: NOT DETECTED
Streptococcus pneumoniae: NOT DETECTED
Streptococcus pyogenes: NOT DETECTED
Streptococcus species: DETECTED — AB

## 2017-06-07 LAB — URINE CULTURE

## 2017-06-07 LAB — CBC
HEMATOCRIT: 29.9 % — AB (ref 36.0–46.0)
HEMOGLOBIN: 9.4 g/dL — AB (ref 12.0–15.0)
MCH: 28.9 pg (ref 26.0–34.0)
MCHC: 31.4 g/dL (ref 30.0–36.0)
MCV: 92 fL (ref 78.0–100.0)
Platelets: 123 10*3/uL — ABNORMAL LOW (ref 150–400)
RBC: 3.25 MIL/uL — ABNORMAL LOW (ref 3.87–5.11)
RDW: 15.4 % (ref 11.5–15.5)
WBC: 5.4 10*3/uL (ref 4.0–10.5)

## 2017-06-07 LAB — BASIC METABOLIC PANEL
ANION GAP: 4 — AB (ref 5–15)
BUN: 13 mg/dL (ref 6–20)
CHLORIDE: 109 mmol/L (ref 101–111)
CO2: 24 mmol/L (ref 22–32)
Calcium: 7.1 mg/dL — ABNORMAL LOW (ref 8.9–10.3)
Creatinine, Ser: 0.59 mg/dL (ref 0.44–1.00)
GFR calc Af Amer: >60 mL/min (ref >60)
GLUCOSE: 104 mg/dL — AB (ref 65–99)
Potassium: 3.3 mmol/L — ABNORMAL LOW (ref 3.5–5.1)
Sodium: 137 mmol/L (ref 135–145)

## 2017-06-07 LAB — TSH: TSH: 0.348 u[IU]/mL — AB (ref 0.350–4.500)

## 2017-06-07 MED ORDER — DEXTROSE 5 % IV SOLN
2.0000 g | INTRAVENOUS | Status: DC
  Administered 2017-06-07 – 2017-06-09 (×3): 2 g via INTRAVENOUS
  Filled 2017-06-07 (×3): qty 2

## 2017-06-07 MED ORDER — POTASSIUM CHLORIDE CRYS ER 20 MEQ PO TBCR
40.0000 meq | EXTENDED_RELEASE_TABLET | Freq: Two times a day (BID) | ORAL | Status: AC
  Administered 2017-06-07 – 2017-06-08 (×2): 40 meq via ORAL
  Filled 2017-06-07 (×2): qty 2

## 2017-06-07 NOTE — Progress Notes (Signed)
PROGRESS NOTE    Dana Carter  OZH:[id number] DOB: Mar 22, 1926 DOA: 06/06/2017 PCP: Cyndi Bender, PA-C   Brief Narrative:  Dana Carter is a 84 y.o. female with medical history significant of HTN, dementia, resident of ALF, presented for evaluation of fevers, chills, change in mental status. Please note that pt is unable to provide any history due to altered mental status and most of the history obtained from available records and ED doctor. Per report, pt has had intermittent chills for 2-3 days, poor oral intake, inability to get out of the bed (at baseline able to bear weight with walker and assistance). In addition, per EMS, oxygen saturation noted to be 88 - 92 % on RA. There was no reports of specific abd concerns, no dyspnea or chest pain reported.    Assessment & Plan:   Active Problems:   Acute metabolic encephalopathy   Acute metabolic encephalopathy:  Secondary to UTI vs dehydration  Much improved. She appears to be back to her baseline as per the family at bedside.    UTI: cultures show multiple bacteria.  She is on rocephin. Afebrile and no leukocytosis.   Abnormal TSH: Thyroid panel ordered. Resume synthroid.    Anemia and mild thrombocytopenia:  Anemia of chronic disease.  Mild thrombocytopenia.    Bacteremia;  One set of blood cultures show streptococci. On 2g of rocephin ordered.    Hypokalemia: replaced.       DVT prophylaxis: lovenox.. Code Status: DNR Family Communication: daughter at bedside.  Disposition Plan: possible d/c back to facility in am.   Consultants:   None.   Procedures:none.  Antimicrobials: rocephin.    Subjective: No new complaints.  Objective: Vitals:   06/06/17 1606 06/06/17 2055 06/07/17 0519 06/07/17 1300  BP: 116/62 (!) 142/65 (!) 112/57 (!) 110/56  Pulse: 78 (!) 107 77 82  Resp: (!) 23 20 18 20   Temp: 98.9 F (37.2 C) 97.7 F (36.5 C) 97.9 F (36.6 C) 98 F (36.7 C)  TempSrc: Oral Axillary Axillary  Oral  SpO2: 99% 100% 99% 100%  Weight:      Height:        Intake/Output Summary (Last 24 hours) at 06/07/2017 1748 Last data filed at 06/07/2017 1634 Gross per 24 hour  Intake 2448.75 ml  Output -  Net 2448.75 ml   Filed Weights   06/06/17 0936  Weight: 60.7 kg (133 lb 12.8 oz)    Examination:  General exam: Appears calm and comfortable  Respiratory system: Clear to auscultation. Respiratory effort normal. Cardiovascular system: S1 & S2 heard, RRR. No JVD, murmurs, rubs, gallops or clicks. No pedal edema. Gastrointestinal system: Abdomen is nondistended, soft and nontender. No organomegaly or masses felt. Normal bowel sounds heard. Central nervous system: confused.  Extremities: Symmetric 5 x 5 power. Skin: No rashes, lesions or ulcers Psychiatry:     Data Reviewed: I have personally reviewed following labs and imaging studies  CBC: Recent Labs  Lab 06/06/17 0951 06/07/17 0648  WBC 7.2 5.4  NEUTROABS 6.2  --   HGB 12.0 9.4*  HCT 36.8 29.9*  MCV 91.1 92.0  PLT 162 629*   Basic Metabolic Panel: Recent Labs  Lab 06/06/17 0951 06/07/17 0648  NA 136 137  K 3.8 3.3*  CL 102 109  CO2 24 24  GLUCOSE 111* 104*  BUN 14 13  CREATININE 0.72 0.59  CALCIUM 8.2* 7.1*   GFR: Estimated Creatinine Clearance: 37.3 mL/min (by C-G formula based on SCr of 0.59  mg/dL). Liver Function Tests: Recent Labs  Lab 06/06/17 0951  AST 17  ALT 8*  ALKPHOS 68  BILITOT 0.6  PROT 6.8  ALBUMIN 3.2*   No results for input(s): LIPASE, AMYLASE in the last 168 hours. No results for input(s): AMMONIA in the last 168 hours. Coagulation Profile: No results for input(s): INR, PROTIME in the last 168 hours. Cardiac Enzymes: No results for input(s): CKTOTAL, CKMB, CKMBINDEX, TROPONINI in the last 168 hours. BNP (last 3 results) No results for input(s): PROBNP in the last 8760 hours. HbA1C: No results for input(s): HGBA1C in the last 72 hours. CBG: No results for input(s): GLUCAP in  the last 168 hours. Lipid Profile: No results for input(s): CHOL, HDL, LDLCALC, TRIG, CHOLHDL, LDLDIRECT in the last 72 hours. Thyroid Function Tests: Recent Labs    06/07/17 0648  TSH 0.348*   Anemia Panel: No results for input(s): VITAMINB12, FOLATE, FERRITIN, TIBC, IRON, RETICCTPCT in the last 72 hours. Sepsis Labs: Recent Labs  Lab 06/06/17 1011 06/06/17 1412  LATICACIDVEN 1.35 1.12    Recent Results (from the past 240 hour(s))  Blood Culture (routine x 2)     Status: None (Preliminary result)   Collection Time: 06/06/17  9:51 AM  Result Value Ref Range Status   Specimen Description BLOOD LEFT ARM  Final   Special Requests   Final    BOTTLES DRAWN AEROBIC AND ANAEROBIC Blood Culture results may not be optimal due to an inadequate volume of blood received in culture bottles   Culture  Setup Time   Final    GRAM POSITIVE COCCI IN CHAINS IN BOTH AEROBIC AND ANAEROBIC BOTTLES CRITICAL RESULT CALLED TO, READ BACK BY AND VERIFIED WITHChales Abrahams [facility] 4818 06/07/17 A BROWNING Performed at [facility], [place] [address] [place], [place] [address]    Culture GRAM POSITIVE COCCI  Final   Report Status PENDING  Incomplete  Blood Culture ID Panel (Reflexed)     Status: Abnormal   Collection Time: 06/06/17  9:51 AM  Result Value Ref Range Status   Enterococcus species NOT DETECTED NOT DETECTED Final   Listeria monocytogenes NOT DETECTED NOT DETECTED Final   Staphylococcus species NOT DETECTED NOT DETECTED Final   Staphylococcus aureus NOT DETECTED NOT DETECTED Final   Streptococcus species DETECTED (A) NOT DETECTED Final    Comment: Not Enterococcus species, Streptococcus agalactiae, Streptococcus pyogenes, or Streptococcus pneumoniae. CRITICAL RESULT CALLED TO, READ BACK BY AND VERIFIED WITH: Chales Abrahams PHARMD [id number] 06/07/17 A BROWNING    Streptococcus agalactiae NOT DETECTED NOT DETECTED Final   Streptococcus pneumoniae NOT DETECTED NOT DETECTED Final   Streptococcus pyogenes NOT  DETECTED NOT DETECTED Final   Acinetobacter baumannii NOT DETECTED NOT DETECTED Final   Enterobacteriaceae species NOT DETECTED NOT DETECTED Final   Enterobacter cloacae complex NOT DETECTED NOT DETECTED Final   Escherichia coli NOT DETECTED NOT DETECTED Final   Klebsiella oxytoca NOT DETECTED NOT DETECTED Final   Klebsiella pneumoniae NOT DETECTED NOT DETECTED Final   Proteus species NOT DETECTED NOT DETECTED Final   Serratia marcescens NOT DETECTED NOT DETECTED Final   Haemophilus influenzae NOT DETECTED NOT DETECTED Final   Neisseria meningitidis NOT DETECTED NOT DETECTED Final   Pseudomonas aeruginosa NOT DETECTED NOT DETECTED Final   Candida albicans NOT DETECTED NOT DETECTED Final   Candida glabrata NOT DETECTED NOT DETECTED Final   Candida krusei NOT DETECTED NOT DETECTED Final   Candida parapsilosis NOT DETECTED NOT DETECTED Final   Candida tropicalis NOT  DETECTED NOT DETECTED Final    Comment: Performed at [facility], [place] [address] [place], [place] [address]  Blood Culture (routine x 2)     Status: None (Preliminary result)   Collection Time: 06/06/17 10:27 AM  Result Value Ref Range Status   Specimen Description BLOOD RIGHT ARM  Final   Special Requests   Final    BOTTLES DRAWN AEROBIC AND ANAEROBIC Blood Culture results may not be optimal due to an inadequate volume of blood received in culture bottles   Culture   Final    NO GROWTH < 24 HOURS Performed at [facility], [place] [address], [place], [place] [address]    Report Status PENDING  Incomplete  Urine culture     Status: Abnormal   Collection Time: 06/06/17 11:48 AM  Result Value Ref Range Status   Specimen Description URINE, CATHETERIZED  Final   Special Requests NONE  Final   Culture MULTIPLE SPECIES PRESENT, SUGGEST RECOLLECTION (A)  Final   Report Status 06/07/2017 FINAL  Final  MRSA PCR Screening     Status: None   Collection Time: 06/06/17  6:01 PM  Result Value Ref Range Status   MRSA by  PCR NEGATIVE NEGATIVE Final    Comment:        The GeneXpert MRSA Assay (FDA approved for NASAL specimens only), is one component of a comprehensive MRSA colonization surveillance program. It is not intended to diagnose MRSA infection nor to guide or monitor treatment for MRSA infections.          Radiology Studies: Dg Chest Port 1 View  Result Date: 06/06/2017 CLINICAL DATA:  Fever and wheezing EXAM: PORTABLE CHEST 1 VIEW COMPARISON:  12/02/2014 FINDINGS: 1 cm nodular density right lung base slightly increased in size from the prior study. Subcentimeter nodular density right mid lung appears slightly increased from the prior study. Cardiac enlargement without heart failure. Negative for pneumonia or effusion. Atherosclerotic aorta IMPRESSION: Right lung nodules, appearing slightly larger than the study on 12/02/2014. CT chest recommended for further evaluation. Electronically Signed   By: Franchot Gallo M.D.   On: 06/06/2017 09:59        Scheduled Meds: . calcium-vitamin D  1 tablet Oral Daily  . chlorhexidine  15 mL Mouth Rinse BID  . cholecalciferol  1,000 Units Oral Daily  . donepezil  10 mg Oral QHS  . enoxaparin (LOVENOX) injection  40 mg Subcutaneous Q24H  . hydrochlorothiazide  25 mg Oral Daily  . hydroxychloroquine  200 mg Oral Daily  . levothyroxine  100 mcg Oral QAC breakfast  . loratadine  10 mg Oral Daily  . mouth rinse  15 mL Mouth Rinse q12n4p   Continuous Infusions: . sodium chloride 75 mL/hr at 06/07/17 1217  . cefTRIAXone (ROCEPHIN)  IV Stopped (06/07/17 1545)     LOS: 1 day    Time spent: 35 minutes    Hosie Poisson, MD [facility] Pager [phone]  If 7PM-7AM, please contact night-coverage [email or web address] Password [facility] 06/07/2017, 5:48 PM

## 2017-06-07 NOTE — Progress Notes (Signed)
PT Note  Patient Details Name: Dana Carter MRN: [id number] DOB: [birth year]   Pt seen earlier today. Pt was able to follow my commands and walk with RW for short distance. (Incontnient of BM during amb) . Pt from [facility] where she gets cared and ambulates short distances to /from her chair and to the dining room, with RW and assistance. Feel pt is at baseline at this time from a mobility standpoint.   Full write up to follow.   Clide Dales 06/07/2017, 2:05 PM  Clide Dales, PT Pager: [phone] 06/07/2017

## 2017-06-07 NOTE — Progress Notes (Signed)
PHARMACY - PHYSICIAN COMMUNICATION CRITICAL VALUE ALERT - BLOOD CULTURE IDENTIFICATION (BCID)  Results for orders placed or performed during the hospital encounter of 06/06/17  Blood Culture ID Panel (Reflexed) (Collected: 06/06/2017  9:51 AM)  Result Value Ref Range   Enterococcus species NOT DETECTED NOT DETECTED   Listeria monocytogenes NOT DETECTED NOT DETECTED   Staphylococcus species NOT DETECTED NOT DETECTED   Staphylococcus aureus NOT DETECTED NOT DETECTED   Streptococcus species DETECTED (A) NOT DETECTED   Streptococcus agalactiae NOT DETECTED NOT DETECTED   Streptococcus pneumoniae NOT DETECTED NOT DETECTED   Streptococcus pyogenes NOT DETECTED NOT DETECTED   Acinetobacter baumannii NOT DETECTED NOT DETECTED   Enterobacteriaceae species NOT DETECTED NOT DETECTED   Enterobacter cloacae complex NOT DETECTED NOT DETECTED   Escherichia coli NOT DETECTED NOT DETECTED   Klebsiella oxytoca NOT DETECTED NOT DETECTED   Klebsiella pneumoniae NOT DETECTED NOT DETECTED   Proteus species NOT DETECTED NOT DETECTED   Serratia marcescens NOT DETECTED NOT DETECTED   Haemophilus influenzae NOT DETECTED NOT DETECTED   Neisseria meningitidis NOT DETECTED NOT DETECTED   Pseudomonas aeruginosa NOT DETECTED NOT DETECTED   Candida albicans NOT DETECTED NOT DETECTED   Candida glabrata NOT DETECTED NOT DETECTED   Candida krusei NOT DETECTED NOT DETECTED   Candida parapsilosis NOT DETECTED NOT DETECTED   Candida tropicalis NOT DETECTED NOT DETECTED    Name of physician (or Provider) ContactedAlger Memos, NP  Changes to prescribed antibiotics required: Increased ceftriaxone to 2 grams IV q24h.    Clayburn Pert, PharmD, BCPS Pager: [phone] 06/07/2017  4:55 AM

## 2017-06-07 NOTE — Evaluation (Signed)
Physical Therapy Evaluation Patient Details Name: Dana Carter MRN: [id number] DOB: [birth year] Today's Date: 06/07/2017   History of Present Illness  Pt from an ALF ([place] : [facility]) where caregivers cue pt and assist with ambulation and mobility. Pt was not her normal self and was brougth in to ED with acute metabolic enchephalopophy.   Clinical Impression  Pt seen earlier today. Pt was able to follow my commands and walk with RW for short distance. (Incontnient of BM during amb so limited distance today) . Pt from [facility] where she gets cared and ambulates short distances to /from her chair and to the dining room, with RW and assistance. Feel pt is close to baseline at this time from a mobility standpoint. Will continue to follow while on acute care to maintain mobility and increase activity tolerance.       Follow Up Recommendations No PT follow up(continue with her plan when she returns to her ALF)    Equipment Recommendations  None recommended by PT    Recommendations for Other Services       Precautions / Restrictions Precautions Precautions: None Restrictions Weight Bearing Restrictions: No      Mobility  Bed Mobility Overal bed mobility: Needs Assistance Bed Mobility: Supine to Sit;Sit to Supine     Supine to sit: Min assist Sit to supine: Mod assist   General bed mobility comments: cues so pt understood what was asked of her. and assistance   Transfers Overall transfer level: Needs assistance Equipment used: Rolling walker (2 wheeled) Transfers: Sit to/from Stand Sit to Stand: Min assist            Ambulation/Gait Ambulation/Gait assistance: Min assist Ambulation Distance (Feet): 20 Feet Assistive device: Rolling walker (2 wheeled) Gait Pattern/deviations: Step-through pattern     General Gait Details: small steps , distance limited today due to pt incontinent of stool while ambulating   Stairs             Wheelchair Mobility    Modified Rankin (Stroke Patients Only)       Balance                                             Pertinent Vitals/Pain Pain Assessment: No/denies pain    Home Living Family/patient expects to be discharged to:: Assisted living               Home Equipment: Walker - 2 wheels      Prior Function Level of Independence: Needs assistance   Gait / Transfers Assistance Needed: with assistance ambulates with RW   ADL's / Homemaking Assistance Needed: assistance from caregivers         Hand Dominance        Extremity/Trunk Assessment        Lower Extremity Assessment Lower Extremity Assessment: Generalized weakness       Communication   Communication: Expressive difficulties  Cognition     Overall Cognitive Status: History of cognitive impairments - at baseline                                        General Comments      Exercises     Assessment/Plan    PT Assessment Patient needs  continued PT services  PT Problem List Decreased strength;Decreased activity tolerance;Decreased mobility       PT Treatment Interventions Gait training;Therapeutic activities    PT Goals (Current goals can be found in the Care Plan section)  Acute Rehab PT Goals PT Goal Formulation: Patient unable to participate in goal setting Time For Goal Achievement: 06/21/17 Potential to Achieve Goals: Good    Frequency Min 3X/week   Barriers to discharge        Co-evaluation               AM-PAC PT "6 Clicks" Daily Activity  Outcome Measure Difficulty turning over in bed (including adjusting bedclothes, sheets and blankets)?: A Lot Difficulty moving from lying on back to sitting on the side of the bed? : Unable Difficulty sitting down on and standing up from a chair with arms (e.g., wheelchair, bedside commode, etc,.)?: A Lot Help needed moving to and from a bed to chair (including a wheelchair)?: A  Little Help needed walking in hospital room?: A Little Help needed climbing 3-5 steps with a railing? : A Lot 6 Click Score: 13    End of Session Equipment Utilized During Treatment: Gait belt Activity Tolerance: Patient tolerated treatment well Patient left: in bed;with bed alarm set Nurse Communication: Mobility status PT Visit Diagnosis: Muscle weakness (generalized) (M62.81)    Time: 4163-8453 PT Time Calculation (min) (ACUTE ONLY): 60 min   Charges:   PT Evaluation $PT Eval Low Complexity: 1 Low PT Treatments $Gait Training: 8-22 mins $Therapeutic Activity: 23-37 mins   PT G Codes:   PT G-Codes **NOT FOR INPATIENT CLASS** Functional Assessment Tool Used: AM-PAC 6 Clicks Basic Mobility    Dana Carter, PT Pager: [phone] 06/07/2017   Dana Carter, Dana Carter 06/07/2017, 5:59 PM

## 2017-06-08 DIAGNOSIS — E039 Hypothyroidism, unspecified: Secondary | ICD-10-CM

## 2017-06-08 DIAGNOSIS — E876 Hypokalemia: Secondary | ICD-10-CM

## 2017-06-08 LAB — BASIC METABOLIC PANEL
ANION GAP: 5 (ref 5–15)
BUN: 9 mg/dL (ref 6–20)
CHLORIDE: 107 mmol/L (ref 101–111)
CO2: 23 mmol/L (ref 22–32)
Calcium: 7.5 mg/dL — ABNORMAL LOW (ref 8.9–10.3)
Creatinine, Ser: 0.42 mg/dL — ABNORMAL LOW (ref 0.44–1.00)
GFR calc Af Amer: >60 mL/min (ref >60)
GFR calc non Af Amer: >60 mL/min (ref >60)
Glucose, Bld: 113 mg/dL — ABNORMAL HIGH (ref 65–99)
POTASSIUM: 3.3 mmol/L — AB (ref 3.5–5.1)
SODIUM: 135 mmol/L (ref 135–145)

## 2017-06-08 LAB — HEMOGLOBIN AND HEMATOCRIT, BLOOD
HEMATOCRIT: 32.1 % — AB (ref 36.0–46.0)
HEMOGLOBIN: 10.5 g/dL — AB (ref 12.0–15.0)

## 2017-06-08 LAB — T4, FREE: Free T4: 1.1 ng/dL (ref 0.61–1.12)

## 2017-06-08 MED ORDER — POTASSIUM CHLORIDE CRYS ER 20 MEQ PO TBCR
40.0000 meq | EXTENDED_RELEASE_TABLET | Freq: Two times a day (BID) | ORAL | Status: AC
  Administered 2017-06-08: 40 meq via ORAL
  Filled 2017-06-08: qty 2

## 2017-06-08 NOTE — Progress Notes (Signed)
PROGRESS NOTE    Dana Carter  YSA:[id number] DOB: 16-Jun-1925 DOA: 06/06/2017 PCP: Cyndi Bender, PA-C   Brief Narrative:  Dana Carter is a 84 y.o. female with medical history significant of HTN, dementia, resident of ALF, presented for evaluation of fevers, chills, change in mental status. Please note that pt is unable to provide any history due to altered mental status and most of the history obtained from available records and ED doctor. Per report, pt has had intermittent chills for 2-3 days, poor oral intake, inability to get out of the bed (at baseline able to bear weight with walker and assistance). In addition, per EMS, oxygen saturation noted to be 88 - 92 % on RA. There was no reports of specific abd concerns, no dyspnea or chest pain reported.    Assessment & Plan:   Active Problems:   Acute metabolic encephalopathy   Acute lower UTI   Hypokalemia   Hypothyroidism   Anemia due to chronic kidney disease   Thrombocytopenia (HCC)   Acute metabolic encephalopathy:  Secondary to UTI vs dehydration  Much improved. She appears to be back to her baseline as per the family at bedside.  Overnight no new complaints of agitation or confusion.  UTI: Urine cultures show multiple bacteria.  She is on rocephin. Afebrile and no leukocytosis.   Abnormal TSH: Thyroid panel ordered. Resume synthroid.    Anemia and mild thrombocytopenia:  Anemia of chronic disease.  We do not know patient's baseline hemoglobin, on admission the hemoglobin was 12 probably hemoconcentrated sample and with fluids and adequate hydration hemoglobin has dropped to 9.4.  Repeat H&H will be ordered today. Mild thrombocytopenia.    Bacteremia;  One set of blood cultures show gram-positive cocci/streptococci. On 2g of rocephin ordered.  Further identification and susceptibilities are pending.  Patient remains afebrile and WBC count is normal.   Hypokalemia: replaced.  Repeat potassium still low at 3.3  further supplementation ordered.      DVT prophylaxis: lovenox.. Code Status: DNR Family Communication: daughter at bedside.  Disposition Plan: possible d/c back to facility in am.   Consultants:   None.   Procedures:none.  Antimicrobials: rocephin.    Subjective: No new complaints.  Objective: Vitals:   06/07/17 0519 06/07/17 1300 06/07/17 2058 06/08/17 0525  BP: (!) 112/57 (!) 110/56 (!) 150/59 (!) 153/66  Pulse: 77 82 92 70  Resp: 18 20 15 17   Temp: 97.9 F (36.6 C) 98 F (36.7 C) 98.6 F (37 C) 98.4 F (36.9 C)  TempSrc: Axillary Oral Axillary Axillary  SpO2: 99% 100% 100% 99%  Weight:      Height:        Intake/Output Summary (Last 24 hours) at 06/08/2017 0924 Last data filed at 06/08/2017 0526 Gross per 24 hour  Intake 1752.5 ml  Output 625 ml  Net 1127.5 ml   Filed Weights   06/06/17 0936  Weight: 60.7 kg (133 lb 12.8 oz)    Examination:  General exam: Appears calm and comfortable not in any kind of distress Respiratory system: Clear to auscultation. Respiratory effort normal.  No wheezing or rhonchi Cardiovascular system: S1 & S2 heard, RRR. No JVD, murmurs,. No pedal edema. Gastrointestinal system: Abdomen is soft nontender, nondistended, bowel sounds are heard. Central nervous system: confused.  Extremities: Symmetric 5 x 5 power. Skin: No rashes, lesions or ulcers Psychiatry: Confused but no agitation.    Data Reviewed: I have personally reviewed following labs and imaging studies  CBC: Recent Labs  Lab 06/06/17 0951 06/07/17 0648  WBC 7.2 5.4  NEUTROABS 6.2  --   HGB 12.0 9.4*  HCT 36.8 29.9*  MCV 91.1 92.0  PLT 162 774*   Basic Metabolic Panel: Recent Labs  Lab 06/06/17 0951 06/07/17 0648 06/08/17 0638  NA 136 137 135  K 3.8 3.3* 3.3*  CL 102 109 107  CO2 24 24 23   GLUCOSE 111* 104* 113*  BUN 14 13 9   CREATININE 0.72 0.59 0.42*  CALCIUM 8.2* 7.1* 7.5*   GFR: Estimated Creatinine Clearance: 37.3 mL/min (A) (by C-G  formula based on SCr of 0.42 mg/dL (L)). Liver Function Tests: Recent Labs  Lab 06/06/17 0951  AST 17  ALT 8*  ALKPHOS 68  BILITOT 0.6  PROT 6.8  ALBUMIN 3.2*   No results for input(s): LIPASE, AMYLASE in the last 168 hours. No results for input(s): AMMONIA in the last 168 hours. Coagulation Profile: No results for input(s): INR, PROTIME in the last 168 hours. Cardiac Enzymes: No results for input(s): CKTOTAL, CKMB, CKMBINDEX, TROPONINI in the last 168 hours. BNP (last 3 results) No results for input(s): PROBNP in the last 8760 hours. HbA1C: No results for input(s): HGBA1C in the last 72 hours. CBG: No results for input(s): GLUCAP in the last 168 hours. Lipid Profile: No results for input(s): CHOL, HDL, LDLCALC, TRIG, CHOLHDL, LDLDIRECT in the last 72 hours. Thyroid Function Tests: Recent Labs    06/07/17 0648  TSH 0.348*   Anemia Panel: No results for input(s): VITAMINB12, FOLATE, FERRITIN, TIBC, IRON, RETICCTPCT in the last 72 hours. Sepsis Labs: Recent Labs  Lab 06/06/17 1011 06/06/17 1412  LATICACIDVEN 1.35 1.12    Recent Results (from the past 240 hour(s))  Blood Culture (routine x 2)     Status: None (Preliminary result)   Collection Time: 06/06/17  9:51 AM  Result Value Ref Range Status   Specimen Description BLOOD LEFT ARM  Final   Special Requests   Final    BOTTLES DRAWN AEROBIC AND ANAEROBIC Blood Culture results may not be optimal due to an inadequate volume of blood received in culture bottles   Culture  Setup Time   Final    GRAM POSITIVE COCCI IN CHAINS IN BOTH AEROBIC AND ANAEROBIC BOTTLES CRITICAL RESULT CALLED TO, READ BACK BY AND VERIFIED WITHChales Abrahams [facility] 1287 06/07/17 A BROWNING Performed at [facility], [place] [address] [place], [place] [address]    Culture GRAM POSITIVE COCCI  Final   Report Status PENDING  Incomplete  Blood Culture ID Panel (Reflexed)     Status: Abnormal   Collection Time: 06/06/17  9:51 AM  Result Value Ref  Range Status   Enterococcus species NOT DETECTED NOT DETECTED Final   Listeria monocytogenes NOT DETECTED NOT DETECTED Final   Staphylococcus species NOT DETECTED NOT DETECTED Final   Staphylococcus aureus NOT DETECTED NOT DETECTED Final   Streptococcus species DETECTED (A) NOT DETECTED Final    Comment: Not Enterococcus species, Streptococcus agalactiae, Streptococcus pyogenes, or Streptococcus pneumoniae. CRITICAL RESULT CALLED TO, READ BACK BY AND VERIFIED WITH: Chales Abrahams PHARMD 2094 06/07/17 A BROWNING    Streptococcus agalactiae NOT DETECTED NOT DETECTED Final   Streptococcus pneumoniae NOT DETECTED NOT DETECTED Final   Streptococcus pyogenes NOT DETECTED NOT DETECTED Final   Acinetobacter baumannii NOT DETECTED NOT DETECTED Final   Enterobacteriaceae species NOT DETECTED NOT DETECTED Final   Enterobacter cloacae complex NOT DETECTED NOT DETECTED Final   Escherichia coli NOT DETECTED NOT DETECTED Final  Klebsiella oxytoca NOT DETECTED NOT DETECTED Final   Klebsiella pneumoniae NOT DETECTED NOT DETECTED Final   Proteus species NOT DETECTED NOT DETECTED Final   Serratia marcescens NOT DETECTED NOT DETECTED Final   Haemophilus influenzae NOT DETECTED NOT DETECTED Final   Neisseria meningitidis NOT DETECTED NOT DETECTED Final   Pseudomonas aeruginosa NOT DETECTED NOT DETECTED Final   Candida albicans NOT DETECTED NOT DETECTED Final   Candida glabrata NOT DETECTED NOT DETECTED Final   Candida krusei NOT DETECTED NOT DETECTED Final   Candida parapsilosis NOT DETECTED NOT DETECTED Final   Candida tropicalis NOT DETECTED NOT DETECTED Final    Comment: Performed at [facility], [place] [address] [place], [place] [address]  Blood Culture (routine x 2)     Status: None (Preliminary result)   Collection Time: 06/06/17 10:27 AM  Result Value Ref Range Status   Specimen Description BLOOD RIGHT ARM  Final   Special Requests   Final    BOTTLES DRAWN AEROBIC AND ANAEROBIC Blood Culture  results may not be optimal due to an inadequate volume of blood received in culture bottles   Culture   Final    NO GROWTH < 24 HOURS Performed at [facility], [place] [address], [place], [place] [address]    Report Status PENDING  Incomplete  Urine culture     Status: Abnormal   Collection Time: 06/06/17 11:48 AM  Result Value Ref Range Status   Specimen Description URINE, CATHETERIZED  Final   Special Requests NONE  Final   Culture MULTIPLE SPECIES PRESENT, SUGGEST RECOLLECTION (A)  Final   Report Status 06/07/2017 FINAL  Final  MRSA PCR Screening     Status: None   Collection Time: 06/06/17  6:01 PM  Result Value Ref Range Status   MRSA by PCR NEGATIVE NEGATIVE Final    Comment:        The GeneXpert MRSA Assay (FDA approved for NASAL specimens only), is one component of a comprehensive MRSA colonization surveillance program. It is not intended to diagnose MRSA infection nor to guide or monitor treatment for MRSA infections.          Radiology Studies: Dg Chest Port 1 View  Result Date: 06/06/2017 CLINICAL DATA:  Fever and wheezing EXAM: PORTABLE CHEST 1 VIEW COMPARISON:  12/02/2014 FINDINGS: 1 cm nodular density right lung base slightly increased in size from the prior study. Subcentimeter nodular density right mid lung appears slightly increased from the prior study. Cardiac enlargement without heart failure. Negative for pneumonia or effusion. Atherosclerotic aorta IMPRESSION: Right lung nodules, appearing slightly larger than the study on 12/02/2014. CT chest recommended for further evaluation. Electronically Signed   By: Franchot Gallo M.D.   On: 06/06/2017 09:59        Scheduled Meds: . calcium-vitamin D  1 tablet Oral Daily  . chlorhexidine  15 mL Mouth Rinse BID  . cholecalciferol  1,000 Units Oral Daily  . donepezil  10 mg Oral QHS  . enoxaparin (LOVENOX) injection  40 mg Subcutaneous Q24H  . hydroxychloroquine  200 mg Oral Daily  . levothyroxine  100  mcg Oral QAC breakfast  . loratadine  10 mg Oral Daily  . mouth rinse  15 mL Mouth Rinse q12n4p   Continuous Infusions: . sodium chloride 75 mL/hr at 06/07/17 2242  . cefTRIAXone (ROCEPHIN)  IV Stopped (06/07/17 1545)     LOS: 2 days    Time spent: 35 minutes    Hosie Poisson, MD  [facility] Pager [phone]  If 7PM-7AM, please contact night-coverage [email or web address] Password [email or web address] 06/08/2017, 9:24 AM

## 2017-06-09 DIAGNOSIS — D631 Anemia in chronic kidney disease: Secondary | ICD-10-CM

## 2017-06-09 DIAGNOSIS — N183 Chronic kidney disease, stage 3 (moderate): Secondary | ICD-10-CM

## 2017-06-09 LAB — T3, FREE: T3 FREE: 1.3 pg/mL — AB (ref 2.0–4.4)

## 2017-06-09 MED ORDER — CEPHALEXIN 500 MG PO CAPS: 500.0000 mg | ORAL_CAPSULE | Freq: Two times a day (BID) | ORAL | 0 refills | Status: AC

## 2017-06-09 NOTE — NC FL2 (Signed)
[place] MEDICAID FL2 LEVEL OF CARE SCREENING TOOL     IDENTIFICATION  Patient Name: Dana Carter Birthdate: [birth year] Sex: female Admission Date (Current Location): 06/06/2017  [facility] and [place] Number:  Herbalist and Address:  [facility],  [place] [address], [place]      Provider Number: [id number]  Attending Physician Name and Address:  Hosie Poisson, MD  Relative Name and Phone Number:       Current Level of Care: Hospital Recommended Level of Care: [place] Prior Approval Number:    Date Approved/Denied:   PASRR Number:    Discharge Plan: Other (Comment)(ALF)    Current Diagnoses: Patient Active Problem List   Diagnosis Date Noted  . Acute lower UTI 06/07/2017  . Hypokalemia 06/07/2017  . Hypothyroidism 06/07/2017  . Anemia due to chronic kidney disease 06/07/2017  . Thrombocytopenia ([place]) 06/07/2017  . Acute metabolic encephalopathy 38/93/7342    Orientation RESPIRATION BLADDER Height & Weight     Self  O2 Incontinent Weight: 133 lb 12.8 oz (60.7 kg) Height:  4' 10.5" (148.6 cm)  BEHAVIORAL SYMPTOMS/MOOD NEUROLOGICAL BOWEL NUTRITION STATUS      Incontinent Diet(Low Sodium/ See DC summary)  AMBULATORY STATUS COMMUNICATION OF NEEDS Skin   Independent(Uses walker) Verbally Normal                       Personal Care Assistance Level of Assistance  Bathing, Feeding, Dressing Bathing Assistance: Limited assistance Feeding assistance: Limited assistance Dressing Assistance: Limited assistance     Functional Limitations Info  Sight, Hearing, Speech Sight Info: Adequate Hearing Info: Adequate Speech Info: Adequate    SPECIAL CARE FACTORS FREQUENCY                       Contractures Contractures Info: Not present    Additional Factors Info  Code Status, Allergies Code Status Info: DNR Allergies Info: : Aspirin, Meloxicam, Sulfa Antibiotics           Current Medications  (06/09/2017):  This is the current hospital active medication list Current Facility-Administered Medications  Medication Dose Route Frequency Provider Last Rate Last Dose  . 0.9 %  sodium chloride infusion   Intravenous Continuous Theodis Blaze, MD 75 mL/hr at 06/08/17 1756    . acetaminophen (TYLENOL) tablet 500 mg  500 mg Oral Q6H PRN Theodis Blaze, MD      . calcium-vitamin D (OSCAL WITH D) 500-200 MG-UNIT per tablet 1 tablet  1 tablet Oral Daily Theodis Blaze, MD   1 tablet at 06/09/17 0930  . cefTRIAXone (ROCEPHIN) 2 g in dextrose 5 % 50 mL IVPB  2 g Intravenous Q24H Bodenheimer, Charles A, NP   Stopped at 06/08/17 1430  . chlorhexidine (PERIDEX) 0.12 % solution 15 mL  15 mL Mouth Rinse BID Theodis Blaze, MD   15 mL at 06/09/17 0930  . cholecalciferol (VITAMIN D) tablet 1,000 Units  1,000 Units Oral Daily Theodis Blaze, MD   1,000 Units at 06/09/17 0930  . donepezil (ARICEPT) tablet 10 mg  10 mg Oral QHS Theodis Blaze, MD   10 mg at 06/08/17 2136  . enoxaparin (LOVENOX) injection 40 mg  40 mg Subcutaneous Q24H Theodis Blaze, MD   40 mg at 06/08/17 1756  . guaiFENesin-dextromethorphan (ROBITUSSIN DM) 100-10 MG/5ML syrup 10 mL  10 mL Oral Q6H PRN Theodis Blaze, MD      .  hydroxychloroquine (PLAQUENIL) tablet 200 mg  200 mg Oral Daily Theodis Blaze, MD   200 mg at 06/09/17 0931  . levothyroxine (SYNTHROID, LEVOTHROID) tablet 100 mcg  100 mcg Oral QAC breakfast Theodis Blaze, MD   100 mcg at 06/09/17 0931  . loperamide (IMODIUM) capsule 2 mg  2 mg Oral BID PRN Theodis Blaze, MD      . loratadine (CLARITIN) tablet 10 mg  10 mg Oral Daily Theodis Blaze, MD   10 mg at 06/09/17 0931  . MEDLINE mouth rinse  15 mL Mouth Rinse q12n4p Theodis Blaze, MD   15 mL at 06/08/17 1756  . ondansetron (ZOFRAN) tablet 4 mg  4 mg Oral Q6H PRN Theodis Blaze, MD       Or  . ondansetron [facility]) injection 4 mg  4 mg Intravenous Q6H PRN Theodis Blaze, MD         Discharge Medications: Please see  discharge summary for a list of discharge medications.  Relevant Imaging Results:  Relevant Lab Results:   Additional Information [id number]  Servando Snare, LCSW

## 2017-06-09 NOTE — Discharge Summary (Signed)
Physician Discharge Summary  Dana Carter:[id number] DOB: [birth year] DOA: 06/06/2017  PCP: Cyndi Bender, PA-C  Admit date: 06/06/2017 Discharge date: 06/09/2017  Admitted From: ALF Disposition:  [facility].  Recommendations for Outpatient Follow-up:  1. Follow up with PCP in 1-2 weeks 2. Please obtain BMP/CBC by Friday.    Discharge Condition:stable.  CODE STATUS: DNR Diet recommendation: Heart Healthy  Brief/Interim Summary: Dana Marcoe Teagueis a 84 y.o.femalewith medical history significant ofHTN, dementia, resident of ALF, presented for evaluation of fevers, chills, change in mental status. Please note that pt is unable to provide any history due to altered mental status and most of the history obtained from available records and ED doctor. Per report, pt has had intermittent chills for 2-3 days, poor oral intake, inability to get out of the bed (at baseline able to bear weight with walker and assistance). In addition, per EMS, oxygen saturation noted to be 88 - 92 % on RA. There was no reports of specific abd concerns, no dyspnea or chest pain reported.    Discharge Diagnoses:  Active Problems:   Acute metabolic encephalopathy   Acute lower UTI   Hypokalemia   Hypothyroidism   Anemia due to chronic kidney disease   Thrombocytopenia (HCC)  Acute metabolic encephalopathy:  Secondary to UTI vs dehydration  Much improved. She appears to be back to her baseline as per the family at bedside.  Overnight no new complaints of agitation or confusion.  UTI: Urine cultures show multiple bacteria.  She received 4 days of IV rocephin and plan for d/c on keflex to complete the course.  . Afebrile and no leukocytosis.   Abnormal TSH: Thyroid panel ordered, not significant, recommend repeating the panel in 4 weeks. Marland Kitchen Resume synthroid.    Anemia and mild thrombocytopenia:  Anemia of chronic disease.  We do not know patient's baseline hemoglobin, on admission the  hemoglobin was 12 probably hemoconcentrated sample and with fluids and adequate hydration hemoglobin has dropped to 9.4.  Repeat H&H is around 10. Recommend outpatient follow up of the anemia.  Mild thrombocytopenia.    Bacteremia;  One set of blood cultures show gram-positive cocci/streptococci. On 2g of rocephin ordered.  discussed with Dr Baxter Flattery, suggested its probably a contaminant,. No further work up. Patient remains afebrile and WBC count is normal.   Hypokalemia: replaced.  Repeat potassium still low at 3.3 further supplementation ordered.     Discharge Instructions  Discharge Instructions    Diet - low sodium heart healthy   Complete by:  As directed    Discharge instructions   Complete by:  As directed    Please follow up with PCP in one week and check BMP and CBC by the end of Friday.     Allergies as of 06/09/2017      Reactions   Aspirin Swelling   Meloxicam Swelling   Sulfa Antibiotics    Noted from [facility]      Medication List    TAKE these medications   acetaminophen 500 MG tablet Commonly known as:  TYLENOL Take 500 mg by mouth every 6 (six) hours as needed for mild pain or moderate pain.   Calcium 600-200 MG-UNIT tablet Take 1 tablet by mouth daily.   cephALEXin 500 MG capsule Commonly known as:  KEFLEX Take 1 capsule (500 mg total) by mouth 2 (two) times daily for 4 days. Start taking on:  06/10/2017   cetirizine 10 MG tablet Commonly known as:  ZYRTEC Take 10  mg by mouth daily.   cholecalciferol 1000 units tablet Commonly known as:  VITAMIN D Take 1,000 Units by mouth daily.   donepezil 10 MG tablet Commonly known as:  ARICEPT Take 10 mg by mouth at bedtime.   guaiFENesin-dextromethorphan 100-10 MG/5ML syrup Commonly known as:  ROBITUSSIN DM Take 10 mLs by mouth every 6 (six) hours as needed for cough.   hydrochlorothiazide 25 MG tablet Commonly known as:  HYDRODIURIL Take 25 mg by mouth daily.   hydroxychloroquine 200 MG  tablet Commonly known as:  PLAQUENIL Take 200 mg by mouth daily.   levothyroxine 100 MCG tablet Commonly known as:  SYNTHROID, LEVOTHROID Take 100 mcg by mouth daily before breakfast.   loperamide 2 MG capsule Commonly known as:  IMODIUM Take 2 mg by mouth 2 (two) times daily as needed for diarrhea or loose stools.   potassium chloride SA 20 MEQ tablet Commonly known as:  K-DUR,KLOR-CON Take 10 mEq by mouth daily. Pt takes 1/2 tab daily   vitamin B-12 1000 MCG tablet Commonly known as:  CYANOCOBALAMIN Take 500 mcg by mouth daily. Pt takes 1/2 tab daily       Allergies  Allergen Reactions  . Aspirin Swelling  . Meloxicam Swelling  . Sulfa Antibiotics     Noted from [facility]    Consultations:  None.    Procedures/Studies: Dg Chest Port 1 View  Result Date: 06/06/2017 CLINICAL DATA:  Fever and wheezing EXAM: PORTABLE CHEST 1 VIEW COMPARISON:  12/02/2014 FINDINGS: 1 cm nodular density right lung base slightly increased in size from the prior study. Subcentimeter nodular density right mid lung appears slightly increased from the prior study. Cardiac enlargement without heart failure. Negative for pneumonia or effusion. Atherosclerotic aorta IMPRESSION: Right lung nodules, appearing slightly larger than the study on 12/02/2014. CT chest recommended for further evaluation. Electronically Signed   By: Franchot Gallo M.D.   On: 06/06/2017 09:59       Subjective: No new complaints.   Discharge Exam: Vitals:   06/08/17 2242 06/09/17 0415  BP: (!) 155/89 (!) 160/98  Pulse: 83 82  Resp: 18 20  Temp: 98.2 F (36.8 C) 98 F (36.7 C)  SpO2: 100% 100%   Vitals:   06/08/17 0525 06/08/17 1311 06/08/17 2242 06/09/17 0415  BP: (!) 153/66 113/61 (!) 155/89 (!) 160/98  Pulse: 70 (!) 103 83 82  Resp: 17 18 18 20   Temp: 98.4 F (36.9 C) 97.6 F (36.4 C) 98.2 F (36.8 C) 98 F (36.7 C)  TempSrc: Axillary Oral Oral Oral  SpO2: 99% 100% 100% 100%  Weight:      Height:         General: Pt is alert, awake, not in acute distress Cardiovascular: RRR, S1/S2 +, no rubs, no gallops Respiratory: CTA bilaterally, no wheezing, no rhonchi Abdominal: Soft, NT, ND, bowel sounds + Extremities: no edema, no cyanosis    The results of significant diagnostics from this hospitalization (including imaging, microbiology, ancillary and laboratory) are listed below for reference.     Microbiology: Recent Results (from the past 240 hour(s))  Blood Culture (routine x 2)     Status: Abnormal (Preliminary result)   Collection Time: 06/06/17  9:51 AM  Result Value Ref Range Status   Specimen Description BLOOD LEFT ARM  Final   Special Requests   Final    BOTTLES DRAWN AEROBIC AND ANAEROBIC Blood Culture results may not be optimal due to an inadequate volume of blood received in culture bottles  Culture  Setup Time   Final    GRAM POSITIVE COCCI IN CHAINS IN BOTH AEROBIC AND ANAEROBIC BOTTLES CRITICAL RESULT CALLED TO, READ BACK BY AND VERIFIED WITH: Chales Abrahams PHARMD [id number] 06/07/17 A BROWNING    Culture (A)  Final    ROTHIA MUCILAGINOSA GRAM POSITIVE COCCI IDENTIFICATION TO FOLLOW Performed at [facility], [place] [address], [place], [place] [address]    Report Status PENDING  Incomplete  Blood Culture ID Panel (Reflexed)     Status: Abnormal   Collection Time: 06/06/17  9:51 AM  Result Value Ref Range Status   Enterococcus species NOT DETECTED NOT DETECTED Final   Listeria monocytogenes NOT DETECTED NOT DETECTED Final   Staphylococcus species NOT DETECTED NOT DETECTED Final   Staphylococcus aureus NOT DETECTED NOT DETECTED Final   Streptococcus species DETECTED (A) NOT DETECTED Final    Comment: Not Enterococcus species, Streptococcus agalactiae, Streptococcus pyogenes, or Streptococcus pneumoniae. CRITICAL RESULT CALLED TO, READ BACK BY AND VERIFIED WITH: Chales Abrahams PHARMD [id number] 06/07/17 A BROWNING    Streptococcus agalactiae NOT DETECTED NOT DETECTED Final    Streptococcus pneumoniae NOT DETECTED NOT DETECTED Final   Streptococcus pyogenes NOT DETECTED NOT DETECTED Final   Acinetobacter baumannii NOT DETECTED NOT DETECTED Final   Enterobacteriaceae species NOT DETECTED NOT DETECTED Final   Enterobacter cloacae complex NOT DETECTED NOT DETECTED Final   Escherichia coli NOT DETECTED NOT DETECTED Final   Klebsiella oxytoca NOT DETECTED NOT DETECTED Final   Klebsiella pneumoniae NOT DETECTED NOT DETECTED Final   Proteus species NOT DETECTED NOT DETECTED Final   Serratia marcescens NOT DETECTED NOT DETECTED Final   Haemophilus influenzae NOT DETECTED NOT DETECTED Final   Neisseria meningitidis NOT DETECTED NOT DETECTED Final   Pseudomonas aeruginosa NOT DETECTED NOT DETECTED Final   Candida albicans NOT DETECTED NOT DETECTED Final   Candida glabrata NOT DETECTED NOT DETECTED Final   Candida krusei NOT DETECTED NOT DETECTED Final   Candida parapsilosis NOT DETECTED NOT DETECTED Final   Candida tropicalis NOT DETECTED NOT DETECTED Final    Comment: Performed at [facility], [place]. [address], [place], [place] [address]  Blood Culture (routine x 2)     Status: None (Preliminary result)   Collection Time: 06/06/17 10:27 AM  Result Value Ref Range Status   Specimen Description BLOOD RIGHT ARM  Final   Special Requests   Final    BOTTLES DRAWN AEROBIC AND ANAEROBIC Blood Culture results may not be optimal due to an inadequate volume of blood received in culture bottles   Culture   Final    NO GROWTH 3 DAYS Performed at [facility], [place] [address], [place], [place] [address]    Report Status PENDING  Incomplete  Urine culture     Status: Abnormal   Collection Time: 06/06/17 11:48 AM  Result Value Ref Range Status   Specimen Description URINE, CATHETERIZED  Final   Special Requests NONE  Final   Culture MULTIPLE SPECIES PRESENT, SUGGEST RECOLLECTION (A)  Final   Report Status 06/07/2017 FINAL  Final  MRSA PCR Screening     Status: None    Collection Time: 06/06/17  6:01 PM  Result Value Ref Range Status   MRSA by PCR NEGATIVE NEGATIVE Final    Comment:        The GeneXpert MRSA Assay (FDA approved for NASAL specimens only), is one component of a comprehensive MRSA colonization surveillance program. It is not intended to diagnose MRSA  infection nor to guide or monitor treatment for MRSA infections.      Labs: BNP (last 3 results) No results for input(s): BNP in the last 8760 hours. Basic Metabolic Panel: Recent Labs  Lab 06/06/17 0951 06/07/17 0648 06/08/17 0638  NA 136 137 135  K 3.8 3.3* 3.3*  CL 102 109 107  CO2 24 24 23   GLUCOSE 111* 104* 113*  BUN 14 13 9   CREATININE 0.72 0.59 0.42*  CALCIUM 8.2* 7.1* 7.5*   Liver Function Tests: Recent Labs  Lab 06/06/17 0951  AST 17  ALT 8*  ALKPHOS 68  BILITOT 0.6  PROT 6.8  ALBUMIN 3.2*   No results for input(s): LIPASE, AMYLASE in the last 168 hours. No results for input(s): AMMONIA in the last 168 hours. CBC: Recent Labs  Lab 06/06/17 0951 06/07/17 0648 06/08/17 0638  WBC 7.2 5.4  --   NEUTROABS 6.2  --   --   HGB 12.0 9.4* 10.5*  HCT 36.8 29.9* 32.1*  MCV 91.1 92.0  --   PLT 162 123*  --    Cardiac Enzymes: No results for input(s): CKTOTAL, CKMB, CKMBINDEX, TROPONINI in the last 168 hours. BNP: Invalid input(s): POCBNP CBG: No results for input(s): GLUCAP in the last 168 hours. D-Dimer No results for input(s): DDIMER in the last 72 hours. Hgb A1c No results for input(s): HGBA1C in the last 72 hours. Lipid Profile No results for input(s): CHOL, HDL, LDLCALC, TRIG, CHOLHDL, LDLDIRECT in the last 72 hours. Thyroid function studies Recent Labs    06/07/17 0648 06/08/17 0638  TSH 0.348*  --   T3FREE  --  1.3*   Anemia work up No results for input(s): VITAMINB12, FOLATE, FERRITIN, TIBC, IRON, RETICCTPCT in the last 72 hours. Urinalysis    Component Value Date/Time   COLORURINE YELLOW 06/06/2017 1148   APPEARANCEUR HAZY (A)  06/06/2017 1148   APPEARANCEUR Clear 09/24/2013 1105   LABSPEC 1.019 06/06/2017 1148   LABSPEC 1.009 09/24/2013 1105   PHURINE 6.0 06/06/2017 1148   GLUCOSEU NEGATIVE 06/06/2017 1148   GLUCOSEU Negative 09/24/2013 1105   HGBUR SMALL (A) 06/06/2017 1148   BILIRUBINUR NEGATIVE 06/06/2017 1148   BILIRUBINUR Negative 09/24/2013 1105   KETONESUR 5 (A) 06/06/2017 1148   PROTEINUR 100 (A) 06/06/2017 1148   NITRITE NEGATIVE 06/06/2017 1148   LEUKOCYTESUR LARGE (A) 06/06/2017 1148   LEUKOCYTESUR 3+ 09/24/2013 1105   Sepsis Labs Invalid input(s): PROCALCITONIN,  WBC,  LACTICIDVEN Microbiology Recent Results (from the past 240 hour(s))  Blood Culture (routine x 2)     Status: Abnormal (Preliminary result)   Collection Time: 06/06/17  9:51 AM  Result Value Ref Range Status   Specimen Description BLOOD LEFT ARM  Final   Special Requests   Final    BOTTLES DRAWN AEROBIC AND ANAEROBIC Blood Culture results may not be optimal due to an inadequate volume of blood received in culture bottles   Culture  Setup Time   Final    GRAM POSITIVE COCCI IN CHAINS IN BOTH AEROBIC AND ANAEROBIC BOTTLES CRITICAL RESULT CALLED TO, READ BACK BY AND VERIFIED WITHChales Abrahams PHARMD 0354 06/07/17 A BROWNING    Culture (A)  Final    ROTHIA MUCILAGINOSA GRAM POSITIVE COCCI IDENTIFICATION TO FOLLOW Performed at [facility], [place] [address] [place], [place] [address]    Report Status PENDING  Incomplete  Blood Culture ID Panel (Reflexed)     Status: Abnormal   Collection Time: 06/06/17  9:51 AM  Result Value Ref Range Status   Enterococcus species NOT DETECTED NOT DETECTED Final   Listeria monocytogenes NOT DETECTED NOT DETECTED Final   Staphylococcus species NOT DETECTED NOT DETECTED Final   Staphylococcus aureus NOT DETECTED NOT DETECTED Final   Streptococcus species DETECTED (A) NOT DETECTED Final    Comment: Not Enterococcus species, Streptococcus agalactiae, Streptococcus pyogenes, or Streptococcus  pneumoniae. CRITICAL RESULT CALLED TO, READ BACK BY AND VERIFIED WITH: Chales Abrahams PHARMD [id number] 06/07/17 A BROWNING    Streptococcus agalactiae NOT DETECTED NOT DETECTED Final   Streptococcus pneumoniae NOT DETECTED NOT DETECTED Final   Streptococcus pyogenes NOT DETECTED NOT DETECTED Final   Acinetobacter baumannii NOT DETECTED NOT DETECTED Final   Enterobacteriaceae species NOT DETECTED NOT DETECTED Final   Enterobacter cloacae complex NOT DETECTED NOT DETECTED Final   Escherichia coli NOT DETECTED NOT DETECTED Final   Klebsiella oxytoca NOT DETECTED NOT DETECTED Final   Klebsiella pneumoniae NOT DETECTED NOT DETECTED Final   Proteus species NOT DETECTED NOT DETECTED Final   Serratia marcescens NOT DETECTED NOT DETECTED Final   Haemophilus influenzae NOT DETECTED NOT DETECTED Final   Neisseria meningitidis NOT DETECTED NOT DETECTED Final   Pseudomonas aeruginosa NOT DETECTED NOT DETECTED Final   Candida albicans NOT DETECTED NOT DETECTED Final   Candida glabrata NOT DETECTED NOT DETECTED Final   Candida krusei NOT DETECTED NOT DETECTED Final   Candida parapsilosis NOT DETECTED NOT DETECTED Final   Candida tropicalis NOT DETECTED NOT DETECTED Final    Comment: Performed at [facility], [place]. [address], [place], [place] [address]  Blood Culture (routine x 2)     Status: None (Preliminary result)   Collection Time: 06/06/17 10:27 AM  Result Value Ref Range Status   Specimen Description BLOOD RIGHT ARM  Final   Special Requests   Final    BOTTLES DRAWN AEROBIC AND ANAEROBIC Blood Culture results may not be optimal due to an inadequate volume of blood received in culture bottles   Culture   Final    NO GROWTH 3 DAYS Performed at [facility], [place] [address], [place], [place] [address]    Report Status PENDING  Incomplete  Urine culture     Status: Abnormal   Collection Time: 06/06/17 11:48 AM  Result Value Ref Range Status   Specimen Description URINE, CATHETERIZED  Final    Special Requests NONE  Final   Culture MULTIPLE SPECIES PRESENT, SUGGEST RECOLLECTION (A)  Final   Report Status 06/07/2017 FINAL  Final  MRSA PCR Screening     Status: None   Collection Time: 06/06/17  6:01 PM  Result Value Ref Range Status   MRSA by PCR NEGATIVE NEGATIVE Final    Comment:        The GeneXpert MRSA Assay (FDA approved for NASAL specimens only), is one component of a comprehensive MRSA colonization surveillance program. It is not intended to diagnose MRSA infection nor to guide or monitor treatment for MRSA infections.      Time coordinating discharge: Over 30 minutes  SIGNED:   Hosie Poisson, MD  [facility] 06/09/2017, 12:55 PM Pager   If 7PM-7AM, please contact night-coverage [email or web address] Password [email or web address]

## 2017-06-09 NOTE — Progress Notes (Signed)
[facility] here to transport pt to [facility]

## 2017-06-09 NOTE — Progress Notes (Signed)
Date:  June 09, 2017 Chart reviewed for concurrent status and case management needs.  Will continue to follow patient progress. ./DC back to alf soon. Discharge Planning: following for needs  Expected discharge date: June 12 2017 Velva Harman, BSN, [place], [place]

## 2017-06-09 NOTE — Progress Notes (Signed)
Report called to [facility]. [facility] has been called by the Education officer, museum for pickup. Pts IV removed with a clean and dry dressing intact.

## 2017-06-09 NOTE — Progress Notes (Signed)
Patient will return to [facility].   LCSW confirmed with facility.  Patient will transport by PT[facility].   LCSW notified family.  RN report #: [id number] [phone]  Servando Snare, Shawna Clamp [place] [phone]

## 2017-06-09 NOTE — Progress Notes (Signed)
Date: June 09, 2017 Chart review for discharge needs:  None found for case management. Patient has no questions concerning post hospital care.

## 2017-06-09 NOTE — Clinical Social Work Note (Signed)
Clinical Social Work Assessment  Patient Details  Name: Dana Carter MRN: [id number] Date of Birth: 1926-03-30  Date of referral:  06/09/17               Reason for consult:  Facility Placement                Permission sought to share information with:  Case Manager, Customer service manager, Family Supports Permission granted to share information::  Yes, Verbal Permission Granted  Name::     [name]::  [facility]  Relationship::  Son  Sport and exercise psychologist Information:     Housing/Transportation Living arrangements for the past 2 months:  [place] of Information:  Adult Children, Facility Patient Interpreter Needed:  None Criminal Activity/Legal Involvement Pertinent to Current Situation/Hospitalization:  No - Comment as needed Significant Relationships:  Adult Children, Warehouse manager Lives with:  Facility Resident Do you feel safe going back to the place where you live?  Yes Need for family participation in patient care:  Yes (Comment)  Care giving concerns:  No care giving concerns at the time of assessment.    Social Worker assessment / plan:  LCSW following for ALF palcement.  Patient is from [facility].   LCSW spoke with patient's son, Dana Carter by phone. According to patients son she has been in the facility about 2 years. He reports that patient uses a walker to ambulated and that patient gets around very slowly.   Facility reports that patient does walk, but needs assistance in her ADLs.   PLAN: Patient will return to facility at DC.     Employment status:  Retired Forensic scientist:  Medicare PT Recommendations:  Not assessed at this time Information / Referral to community resources:     Patient/Family's Response to care:  Family thankful for [employer].   Patient/Family's Understanding of and Emotional Response to Diagnosis, Current Treatment, and Prognosis:  Family understands patients current diagnosis  and is agreeable to treatment plan.   Emotional Assessment Appearance:  Appears stated age Attitude/Demeanor/Rapport:    Affect (typically observed):  Calm Orientation:  Oriented to Self Alcohol / Substance use:  Not Applicable Psych involvement (Current and /or in the community):  No (Comment)  Discharge Needs  Concerns to be addressed:  No discharge needs identified Readmission within the last 30 days:  No Current discharge risk:  None Barriers to Discharge:  No Barriers Identified   Servando Snare, LCSW 06/09/2017, 1:06 PM

## 2017-06-10 LAB — CULTURE, BLOOD (ROUTINE X 2)

## 2017-06-11 LAB — CULTURE, BLOOD (ROUTINE X 2): CULTURE: NO GROWTH

## 2017-06-20 DIAGNOSIS — D649 Anemia, unspecified: Secondary | ICD-10-CM | POA: Diagnosis not present

## 2017-06-20 DIAGNOSIS — Z6826 Body mass index (BMI) 26.0-26.9, adult: Secondary | ICD-10-CM | POA: Diagnosis not present

## 2017-06-20 DIAGNOSIS — F0390 Unspecified dementia without behavioral disturbance: Secondary | ICD-10-CM | POA: Diagnosis not present

## 2017-06-20 DIAGNOSIS — E876 Hypokalemia: Secondary | ICD-10-CM | POA: Diagnosis not present

## 2017-06-20 DIAGNOSIS — G9341 Metabolic encephalopathy: Secondary | ICD-10-CM | POA: Diagnosis not present

## 2017-06-20 DIAGNOSIS — N390 Urinary tract infection, site not specified: Secondary | ICD-10-CM | POA: Diagnosis not present

## 2017-07-30 DIAGNOSIS — Z6825 Body mass index (BMI) 25.0-25.9, adult: Secondary | ICD-10-CM | POA: Diagnosis not present

## 2017-07-30 DIAGNOSIS — L03115 Cellulitis of right lower limb: Secondary | ICD-10-CM | POA: Diagnosis not present

## 2017-08-08 DIAGNOSIS — L03115 Cellulitis of right lower limb: Secondary | ICD-10-CM | POA: Diagnosis not present

## 2017-08-08 DIAGNOSIS — M21611 Bunion of right foot: Secondary | ICD-10-CM | POA: Diagnosis not present

## 2017-08-08 DIAGNOSIS — Z1331 Encounter for screening for depression: Secondary | ICD-10-CM | POA: Diagnosis not present

## 2017-09-23 DIAGNOSIS — M069 Rheumatoid arthritis, unspecified: Secondary | ICD-10-CM | POA: Diagnosis not present

## 2017-09-23 DIAGNOSIS — E538 Deficiency of other specified B group vitamins: Secondary | ICD-10-CM | POA: Diagnosis not present

## 2017-09-23 DIAGNOSIS — E039 Hypothyroidism, unspecified: Secondary | ICD-10-CM | POA: Diagnosis not present

## 2017-09-23 DIAGNOSIS — Z79899 Other long term (current) drug therapy: Secondary | ICD-10-CM | POA: Diagnosis not present

## 2017-09-23 DIAGNOSIS — I10 Essential (primary) hypertension: Secondary | ICD-10-CM | POA: Diagnosis not present

## 2017-10-24 DIAGNOSIS — N390 Urinary tract infection, site not specified: Secondary | ICD-10-CM | POA: Diagnosis not present

## 2017-10-24 DIAGNOSIS — F0390 Unspecified dementia without behavioral disturbance: Secondary | ICD-10-CM | POA: Diagnosis not present

## 2017-10-24 DIAGNOSIS — Z6824 Body mass index (BMI) 24.0-24.9, adult: Secondary | ICD-10-CM | POA: Diagnosis not present

## 2017-10-24 DIAGNOSIS — R410 Disorientation, unspecified: Secondary | ICD-10-CM | POA: Diagnosis not present

## 2017-11-05 DIAGNOSIS — F0390 Unspecified dementia without behavioral disturbance: Secondary | ICD-10-CM | POA: Diagnosis not present

## 2017-11-05 DIAGNOSIS — M810 Age-related osteoporosis without current pathological fracture: Secondary | ICD-10-CM | POA: Diagnosis not present

## 2017-11-05 DIAGNOSIS — M0579 Rheumatoid arthritis with rheumatoid factor of multiple sites without organ or systems involvement: Secondary | ICD-10-CM | POA: Diagnosis not present

## 2017-12-11 ENCOUNTER — Ambulatory Visit: Payer: Self-pay | Admitting: Obstetrics & Gynecology

## 2017-12-17 ENCOUNTER — Ambulatory Visit (INDEPENDENT_AMBULATORY_CARE_PROVIDER_SITE_OTHER): Payer: Medicare Other | Admitting: Obstetrics & Gynecology

## 2017-12-17 DIAGNOSIS — R32 Unspecified urinary incontinence: Secondary | ICD-10-CM | POA: Diagnosis not present

## 2017-12-17 DIAGNOSIS — N8189 Other female genital prolapse: Secondary | ICD-10-CM

## 2017-12-17 NOTE — Progress Notes (Signed)
HPI:      [name] is a 84 y.o. who presents today for her pessary to be removed; she has had for many years handled elsewhere but now is in nursing home locally and is less mobile and more forgetful.  Here also for examination related to her pelvic floor weakening.  Pt reports tolerating the pessary well with  no vaginal bleeding and  no vaginal discharge.  Symptoms of pelvic floor weakening have been greatly improved with the pessary in the past.   Pt has not had current pessary checked, cleaned, or removed in a long time (uncertain history).    PMHx: Poor historian, so PMH may not be complete She  has a past medical history of Anxiety, Arthritis, Dementia, Hypertension, Hypothyroid, hypothyroidsim, Osteoporosis, Pre-diabetes, Rheumatoid arthritis ([place]), and Vitamin B12 deficiency. Also,  has no past surgical history on file., family history is not on file.,  reports that she has never smoked. She has never used smokeless tobacco. She reports that she does not drink alcohol or use drugs.  She has a current medication list which includes the following prescription(s): acetaminophen, calcium, cetirizine, cholecalciferol, donepezil, guaifenesin-dextromethorphan, hydrochlorothiazide, hydroxychloroquine, levothyroxine, loperamide, potassium chloride sa, and vitamin b-12. Also, is allergic to aspirin; meloxicam; and sulfa antibiotics.  Review of Systems  Constitutional: Negative for chills, fever and malaise/fatigue.  HENT: Negative for congestion, sinus pain and sore throat.   Eyes: Negative for blurred vision and pain.  Respiratory: Negative for cough and wheezing.   Cardiovascular: Negative for chest pain and leg swelling.  Gastrointestinal: Negative for abdominal pain, constipation, diarrhea, heartburn, nausea and vomiting.  Genitourinary: Negative for dysuria, frequency, hematuria and urgency.  Musculoskeletal: Negative for back pain, joint pain, myalgias and neck pain.  Skin: Negative  for itching and rash.  Neurological: Negative for dizziness, tremors and weakness.  Endo/Heme/Allergies: Does not bruise/bleed easily.  Psychiatric/Behavioral: Positive for memory loss. Negative for depression. The patient is not nervous/anxious and does not have insomnia.    Objective: P80   R20  No fever  Physical Exam  Constitutional: She is oriented to person, place, and time. She appears well-developed and well-nourished. No distress.  Genitourinary: Pelvic exam was performed with patient supine. There is no rash, tenderness or lesion on the right labia. There is no rash, tenderness or lesion on the left labia. Vagina exhibits no rugosity. There is tenderness and bleeding in the vagina. No erythema in the vagina.  There is a foreign body in the vagina.  HENT:  Head: Normocephalic and atraumatic.  Nose: Nose normal.  Mouth/Throat: Oropharynx is clear and moist.  Abdominal: Soft. She exhibits no distension. There is no tenderness.  Musculoskeletal: Normal range of motion.  Neurological: She is alert and oriented to person, place, and time. No cranial nerve deficit.  Skin: Skin is warm and dry.  Psychiatric: She has a normal mood and affect.   Pessary Care Pessary removed (#4 ring w support), with appearance intact but aged and associated with abundant amount of debris (calcified discharge, old blood, other... ?)  Vagina checked - without erosions, some bleeding noted from trauma of pessary removal.  A/P:   Pelvic weakening/prolapse. Pessary holiday advised as is less mobile and more forgetful, as requested by nursing home clinical staff.  This is a change in therapy for this patient.  Alternatives briefly discussed.  Not a surgery candidate.  Will leave pessary out and follow symptoms closely as needed.  Monitor for infection, bleeding, and prolapse sx's.  Eddie Dibbles  Kenton Kingfisher, MD, [facility], [facility] 12/17/2017  10:37 AM

## 2018-01-06 DIAGNOSIS — E876 Hypokalemia: Secondary | ICD-10-CM | POA: Diagnosis not present

## 2018-01-06 DIAGNOSIS — G4700 Insomnia, unspecified: Secondary | ICD-10-CM | POA: Diagnosis not present

## 2018-01-06 DIAGNOSIS — L509 Urticaria, unspecified: Secondary | ICD-10-CM | POA: Diagnosis not present

## 2018-01-06 DIAGNOSIS — M810 Age-related osteoporosis without current pathological fracture: Secondary | ICD-10-CM | POA: Diagnosis not present

## 2018-01-06 DIAGNOSIS — Z960 Presence of urogenital implants: Secondary | ICD-10-CM | POA: Diagnosis not present

## 2018-01-06 DIAGNOSIS — N8110 Cystocele, unspecified: Secondary | ICD-10-CM | POA: Diagnosis not present

## 2018-01-06 DIAGNOSIS — L03115 Cellulitis of right lower limb: Secondary | ICD-10-CM | POA: Diagnosis not present

## 2018-01-06 DIAGNOSIS — I10 Essential (primary) hypertension: Secondary | ICD-10-CM | POA: Diagnosis not present

## 2018-01-06 DIAGNOSIS — M21611 Bunion of right foot: Secondary | ICD-10-CM | POA: Diagnosis not present

## 2018-01-06 DIAGNOSIS — G300 Alzheimer's disease with early onset: Secondary | ICD-10-CM | POA: Diagnosis not present

## 2018-01-06 DIAGNOSIS — F0281 Dementia in other diseases classified elsewhere with behavioral disturbance: Secondary | ICD-10-CM | POA: Diagnosis not present

## 2018-01-06 DIAGNOSIS — E039 Hypothyroidism, unspecified: Secondary | ICD-10-CM | POA: Diagnosis not present

## 2018-01-06 DIAGNOSIS — M069 Rheumatoid arthritis, unspecified: Secondary | ICD-10-CM | POA: Diagnosis not present

## 2018-01-06 DIAGNOSIS — E538 Deficiency of other specified B group vitamins: Secondary | ICD-10-CM | POA: Diagnosis not present

## 2018-01-06 DIAGNOSIS — F419 Anxiety disorder, unspecified: Secondary | ICD-10-CM | POA: Diagnosis not present

## 2018-01-09 DIAGNOSIS — G4700 Insomnia, unspecified: Secondary | ICD-10-CM | POA: Diagnosis not present

## 2018-01-09 DIAGNOSIS — F0281 Dementia in other diseases classified elsewhere with behavioral disturbance: Secondary | ICD-10-CM | POA: Diagnosis not present

## 2018-01-09 DIAGNOSIS — G300 Alzheimer's disease with early onset: Secondary | ICD-10-CM | POA: Diagnosis not present

## 2018-01-09 DIAGNOSIS — F419 Anxiety disorder, unspecified: Secondary | ICD-10-CM | POA: Diagnosis not present

## 2018-01-09 DIAGNOSIS — L03115 Cellulitis of right lower limb: Secondary | ICD-10-CM | POA: Diagnosis not present

## 2018-01-09 DIAGNOSIS — N8110 Cystocele, unspecified: Secondary | ICD-10-CM | POA: Diagnosis not present

## 2018-01-12 DIAGNOSIS — G4700 Insomnia, unspecified: Secondary | ICD-10-CM | POA: Diagnosis not present

## 2018-01-12 DIAGNOSIS — F419 Anxiety disorder, unspecified: Secondary | ICD-10-CM | POA: Diagnosis not present

## 2018-01-12 DIAGNOSIS — N8110 Cystocele, unspecified: Secondary | ICD-10-CM | POA: Diagnosis not present

## 2018-01-12 DIAGNOSIS — L03115 Cellulitis of right lower limb: Secondary | ICD-10-CM | POA: Diagnosis not present

## 2018-01-12 DIAGNOSIS — G300 Alzheimer's disease with early onset: Secondary | ICD-10-CM | POA: Diagnosis not present

## 2018-01-12 DIAGNOSIS — F0281 Dementia in other diseases classified elsewhere with behavioral disturbance: Secondary | ICD-10-CM | POA: Diagnosis not present

## 2018-01-13 DIAGNOSIS — G300 Alzheimer's disease with early onset: Secondary | ICD-10-CM | POA: Diagnosis not present

## 2018-01-13 DIAGNOSIS — F0281 Dementia in other diseases classified elsewhere with behavioral disturbance: Secondary | ICD-10-CM | POA: Diagnosis not present

## 2018-01-13 DIAGNOSIS — G4700 Insomnia, unspecified: Secondary | ICD-10-CM | POA: Diagnosis not present

## 2018-01-13 DIAGNOSIS — F419 Anxiety disorder, unspecified: Secondary | ICD-10-CM | POA: Diagnosis not present

## 2018-01-13 DIAGNOSIS — N8110 Cystocele, unspecified: Secondary | ICD-10-CM | POA: Diagnosis not present

## 2018-01-13 DIAGNOSIS — L03115 Cellulitis of right lower limb: Secondary | ICD-10-CM | POA: Diagnosis not present

## 2018-01-14 DIAGNOSIS — G300 Alzheimer's disease with early onset: Secondary | ICD-10-CM | POA: Diagnosis not present

## 2018-01-14 DIAGNOSIS — F0281 Dementia in other diseases classified elsewhere with behavioral disturbance: Secondary | ICD-10-CM | POA: Diagnosis not present

## 2018-01-14 DIAGNOSIS — N8110 Cystocele, unspecified: Secondary | ICD-10-CM | POA: Diagnosis not present

## 2018-01-14 DIAGNOSIS — G4700 Insomnia, unspecified: Secondary | ICD-10-CM | POA: Diagnosis not present

## 2018-01-14 DIAGNOSIS — L03115 Cellulitis of right lower limb: Secondary | ICD-10-CM | POA: Diagnosis not present

## 2018-01-14 DIAGNOSIS — F419 Anxiety disorder, unspecified: Secondary | ICD-10-CM | POA: Diagnosis not present

## 2018-01-15 DIAGNOSIS — G300 Alzheimer's disease with early onset: Secondary | ICD-10-CM | POA: Diagnosis not present

## 2018-01-15 DIAGNOSIS — G4700 Insomnia, unspecified: Secondary | ICD-10-CM | POA: Diagnosis not present

## 2018-01-15 DIAGNOSIS — F0281 Dementia in other diseases classified elsewhere with behavioral disturbance: Secondary | ICD-10-CM | POA: Diagnosis not present

## 2018-01-15 DIAGNOSIS — N8110 Cystocele, unspecified: Secondary | ICD-10-CM | POA: Diagnosis not present

## 2018-01-15 DIAGNOSIS — F419 Anxiety disorder, unspecified: Secondary | ICD-10-CM | POA: Diagnosis not present

## 2018-01-15 DIAGNOSIS — L03115 Cellulitis of right lower limb: Secondary | ICD-10-CM | POA: Diagnosis not present

## 2018-01-16 DIAGNOSIS — G300 Alzheimer's disease with early onset: Secondary | ICD-10-CM | POA: Diagnosis not present

## 2018-01-16 DIAGNOSIS — F419 Anxiety disorder, unspecified: Secondary | ICD-10-CM | POA: Diagnosis not present

## 2018-01-16 DIAGNOSIS — N8110 Cystocele, unspecified: Secondary | ICD-10-CM | POA: Diagnosis not present

## 2018-01-16 DIAGNOSIS — F0281 Dementia in other diseases classified elsewhere with behavioral disturbance: Secondary | ICD-10-CM | POA: Diagnosis not present

## 2018-01-16 DIAGNOSIS — G4700 Insomnia, unspecified: Secondary | ICD-10-CM | POA: Diagnosis not present

## 2018-01-16 DIAGNOSIS — L03115 Cellulitis of right lower limb: Secondary | ICD-10-CM | POA: Diagnosis not present

## 2018-01-19 DIAGNOSIS — G4700 Insomnia, unspecified: Secondary | ICD-10-CM | POA: Diagnosis not present

## 2018-01-19 DIAGNOSIS — N8110 Cystocele, unspecified: Secondary | ICD-10-CM | POA: Diagnosis not present

## 2018-01-19 DIAGNOSIS — F0281 Dementia in other diseases classified elsewhere with behavioral disturbance: Secondary | ICD-10-CM | POA: Diagnosis not present

## 2018-01-19 DIAGNOSIS — L03115 Cellulitis of right lower limb: Secondary | ICD-10-CM | POA: Diagnosis not present

## 2018-01-19 DIAGNOSIS — F419 Anxiety disorder, unspecified: Secondary | ICD-10-CM | POA: Diagnosis not present

## 2018-01-19 DIAGNOSIS — G300 Alzheimer's disease with early onset: Secondary | ICD-10-CM | POA: Diagnosis not present

## 2018-01-20 DIAGNOSIS — G4700 Insomnia, unspecified: Secondary | ICD-10-CM | POA: Diagnosis not present

## 2018-01-20 DIAGNOSIS — N8110 Cystocele, unspecified: Secondary | ICD-10-CM | POA: Diagnosis not present

## 2018-01-20 DIAGNOSIS — F419 Anxiety disorder, unspecified: Secondary | ICD-10-CM | POA: Diagnosis not present

## 2018-01-20 DIAGNOSIS — G300 Alzheimer's disease with early onset: Secondary | ICD-10-CM | POA: Diagnosis not present

## 2018-01-20 DIAGNOSIS — F0281 Dementia in other diseases classified elsewhere with behavioral disturbance: Secondary | ICD-10-CM | POA: Diagnosis not present

## 2018-01-20 DIAGNOSIS — L03115 Cellulitis of right lower limb: Secondary | ICD-10-CM | POA: Diagnosis not present

## 2018-01-21 DIAGNOSIS — F0281 Dementia in other diseases classified elsewhere with behavioral disturbance: Secondary | ICD-10-CM | POA: Diagnosis not present

## 2018-01-21 DIAGNOSIS — L03115 Cellulitis of right lower limb: Secondary | ICD-10-CM | POA: Diagnosis not present

## 2018-01-21 DIAGNOSIS — N8110 Cystocele, unspecified: Secondary | ICD-10-CM | POA: Diagnosis not present

## 2018-01-21 DIAGNOSIS — G300 Alzheimer's disease with early onset: Secondary | ICD-10-CM | POA: Diagnosis not present

## 2018-01-21 DIAGNOSIS — G4700 Insomnia, unspecified: Secondary | ICD-10-CM | POA: Diagnosis not present

## 2018-01-21 DIAGNOSIS — F419 Anxiety disorder, unspecified: Secondary | ICD-10-CM | POA: Diagnosis not present

## 2018-01-23 DIAGNOSIS — G4700 Insomnia, unspecified: Secondary | ICD-10-CM | POA: Diagnosis not present

## 2018-01-23 DIAGNOSIS — F419 Anxiety disorder, unspecified: Secondary | ICD-10-CM | POA: Diagnosis not present

## 2018-01-23 DIAGNOSIS — G300 Alzheimer's disease with early onset: Secondary | ICD-10-CM | POA: Diagnosis not present

## 2018-01-23 DIAGNOSIS — L03115 Cellulitis of right lower limb: Secondary | ICD-10-CM | POA: Diagnosis not present

## 2018-01-23 DIAGNOSIS — F0281 Dementia in other diseases classified elsewhere with behavioral disturbance: Secondary | ICD-10-CM | POA: Diagnosis not present

## 2018-01-23 DIAGNOSIS — N8110 Cystocele, unspecified: Secondary | ICD-10-CM | POA: Diagnosis not present

## 2018-01-26 DIAGNOSIS — L03115 Cellulitis of right lower limb: Secondary | ICD-10-CM | POA: Diagnosis not present

## 2018-01-26 DIAGNOSIS — G300 Alzheimer's disease with early onset: Secondary | ICD-10-CM | POA: Diagnosis not present

## 2018-01-26 DIAGNOSIS — F419 Anxiety disorder, unspecified: Secondary | ICD-10-CM | POA: Diagnosis not present

## 2018-01-26 DIAGNOSIS — N8110 Cystocele, unspecified: Secondary | ICD-10-CM | POA: Diagnosis not present

## 2018-01-26 DIAGNOSIS — F0281 Dementia in other diseases classified elsewhere with behavioral disturbance: Secondary | ICD-10-CM | POA: Diagnosis not present

## 2018-01-26 DIAGNOSIS — G4700 Insomnia, unspecified: Secondary | ICD-10-CM | POA: Diagnosis not present

## 2018-01-27 DIAGNOSIS — N8110 Cystocele, unspecified: Secondary | ICD-10-CM | POA: Diagnosis not present

## 2018-01-27 DIAGNOSIS — F0281 Dementia in other diseases classified elsewhere with behavioral disturbance: Secondary | ICD-10-CM | POA: Diagnosis not present

## 2018-01-27 DIAGNOSIS — L03115 Cellulitis of right lower limb: Secondary | ICD-10-CM | POA: Diagnosis not present

## 2018-01-27 DIAGNOSIS — G300 Alzheimer's disease with early onset: Secondary | ICD-10-CM | POA: Diagnosis not present

## 2018-01-27 DIAGNOSIS — G4700 Insomnia, unspecified: Secondary | ICD-10-CM | POA: Diagnosis not present

## 2018-01-27 DIAGNOSIS — F419 Anxiety disorder, unspecified: Secondary | ICD-10-CM | POA: Diagnosis not present

## 2018-01-28 DIAGNOSIS — L03115 Cellulitis of right lower limb: Secondary | ICD-10-CM | POA: Diagnosis not present

## 2018-01-28 DIAGNOSIS — G300 Alzheimer's disease with early onset: Secondary | ICD-10-CM | POA: Diagnosis not present

## 2018-01-28 DIAGNOSIS — N8110 Cystocele, unspecified: Secondary | ICD-10-CM | POA: Diagnosis not present

## 2018-01-28 DIAGNOSIS — F0281 Dementia in other diseases classified elsewhere with behavioral disturbance: Secondary | ICD-10-CM | POA: Diagnosis not present

## 2018-01-28 DIAGNOSIS — G4700 Insomnia, unspecified: Secondary | ICD-10-CM | POA: Diagnosis not present

## 2018-01-28 DIAGNOSIS — F419 Anxiety disorder, unspecified: Secondary | ICD-10-CM | POA: Diagnosis not present

## 2018-01-30 DIAGNOSIS — F419 Anxiety disorder, unspecified: Secondary | ICD-10-CM | POA: Diagnosis not present

## 2018-01-30 DIAGNOSIS — G4700 Insomnia, unspecified: Secondary | ICD-10-CM | POA: Diagnosis not present

## 2018-01-30 DIAGNOSIS — G300 Alzheimer's disease with early onset: Secondary | ICD-10-CM | POA: Diagnosis not present

## 2018-01-30 DIAGNOSIS — F0281 Dementia in other diseases classified elsewhere with behavioral disturbance: Secondary | ICD-10-CM | POA: Diagnosis not present

## 2018-01-30 DIAGNOSIS — L03115 Cellulitis of right lower limb: Secondary | ICD-10-CM | POA: Diagnosis not present

## 2018-01-30 DIAGNOSIS — N8110 Cystocele, unspecified: Secondary | ICD-10-CM | POA: Diagnosis not present

## 2018-02-01 DIAGNOSIS — M810 Age-related osteoporosis without current pathological fracture: Secondary | ICD-10-CM | POA: Diagnosis not present

## 2018-02-01 DIAGNOSIS — G4700 Insomnia, unspecified: Secondary | ICD-10-CM | POA: Diagnosis not present

## 2018-02-01 DIAGNOSIS — Z960 Presence of urogenital implants: Secondary | ICD-10-CM | POA: Diagnosis not present

## 2018-02-01 DIAGNOSIS — N8110 Cystocele, unspecified: Secondary | ICD-10-CM | POA: Diagnosis not present

## 2018-02-01 DIAGNOSIS — E876 Hypokalemia: Secondary | ICD-10-CM | POA: Diagnosis not present

## 2018-02-01 DIAGNOSIS — L03115 Cellulitis of right lower limb: Secondary | ICD-10-CM | POA: Diagnosis not present

## 2018-02-01 DIAGNOSIS — F419 Anxiety disorder, unspecified: Secondary | ICD-10-CM | POA: Diagnosis not present

## 2018-02-01 DIAGNOSIS — F0281 Dementia in other diseases classified elsewhere with behavioral disturbance: Secondary | ICD-10-CM | POA: Diagnosis not present

## 2018-02-01 DIAGNOSIS — M069 Rheumatoid arthritis, unspecified: Secondary | ICD-10-CM | POA: Diagnosis not present

## 2018-02-01 DIAGNOSIS — E538 Deficiency of other specified B group vitamins: Secondary | ICD-10-CM | POA: Diagnosis not present

## 2018-02-01 DIAGNOSIS — L509 Urticaria, unspecified: Secondary | ICD-10-CM | POA: Diagnosis not present

## 2018-02-01 DIAGNOSIS — M21611 Bunion of right foot: Secondary | ICD-10-CM | POA: Diagnosis not present

## 2018-02-01 DIAGNOSIS — G300 Alzheimer's disease with early onset: Secondary | ICD-10-CM | POA: Diagnosis not present

## 2018-02-01 DIAGNOSIS — E039 Hypothyroidism, unspecified: Secondary | ICD-10-CM | POA: Diagnosis not present

## 2018-02-01 DIAGNOSIS — I10 Essential (primary) hypertension: Secondary | ICD-10-CM | POA: Diagnosis not present

## 2018-02-03 DIAGNOSIS — G4700 Insomnia, unspecified: Secondary | ICD-10-CM | POA: Diagnosis not present

## 2018-02-03 DIAGNOSIS — F0281 Dementia in other diseases classified elsewhere with behavioral disturbance: Secondary | ICD-10-CM | POA: Diagnosis not present

## 2018-02-03 DIAGNOSIS — F419 Anxiety disorder, unspecified: Secondary | ICD-10-CM | POA: Diagnosis not present

## 2018-02-03 DIAGNOSIS — G300 Alzheimer's disease with early onset: Secondary | ICD-10-CM | POA: Diagnosis not present

## 2018-02-03 DIAGNOSIS — N8110 Cystocele, unspecified: Secondary | ICD-10-CM | POA: Diagnosis not present

## 2018-02-03 DIAGNOSIS — L03115 Cellulitis of right lower limb: Secondary | ICD-10-CM | POA: Diagnosis not present

## 2018-02-04 DIAGNOSIS — N8110 Cystocele, unspecified: Secondary | ICD-10-CM | POA: Diagnosis not present

## 2018-02-04 DIAGNOSIS — F0281 Dementia in other diseases classified elsewhere with behavioral disturbance: Secondary | ICD-10-CM | POA: Diagnosis not present

## 2018-02-04 DIAGNOSIS — F419 Anxiety disorder, unspecified: Secondary | ICD-10-CM | POA: Diagnosis not present

## 2018-02-04 DIAGNOSIS — L03115 Cellulitis of right lower limb: Secondary | ICD-10-CM | POA: Diagnosis not present

## 2018-02-04 DIAGNOSIS — G300 Alzheimer's disease with early onset: Secondary | ICD-10-CM | POA: Diagnosis not present

## 2018-02-04 DIAGNOSIS — G4700 Insomnia, unspecified: Secondary | ICD-10-CM | POA: Diagnosis not present

## 2018-02-06 DIAGNOSIS — G4700 Insomnia, unspecified: Secondary | ICD-10-CM | POA: Diagnosis not present

## 2018-02-06 DIAGNOSIS — G300 Alzheimer's disease with early onset: Secondary | ICD-10-CM | POA: Diagnosis not present

## 2018-02-06 DIAGNOSIS — L03115 Cellulitis of right lower limb: Secondary | ICD-10-CM | POA: Diagnosis not present

## 2018-02-06 DIAGNOSIS — F0281 Dementia in other diseases classified elsewhere with behavioral disturbance: Secondary | ICD-10-CM | POA: Diagnosis not present

## 2018-02-06 DIAGNOSIS — F419 Anxiety disorder, unspecified: Secondary | ICD-10-CM | POA: Diagnosis not present

## 2018-02-06 DIAGNOSIS — N8110 Cystocele, unspecified: Secondary | ICD-10-CM | POA: Diagnosis not present

## 2018-02-09 DIAGNOSIS — N8110 Cystocele, unspecified: Secondary | ICD-10-CM | POA: Diagnosis not present

## 2018-02-09 DIAGNOSIS — G300 Alzheimer's disease with early onset: Secondary | ICD-10-CM | POA: Diagnosis not present

## 2018-02-09 DIAGNOSIS — G4700 Insomnia, unspecified: Secondary | ICD-10-CM | POA: Diagnosis not present

## 2018-02-09 DIAGNOSIS — F419 Anxiety disorder, unspecified: Secondary | ICD-10-CM | POA: Diagnosis not present

## 2018-02-09 DIAGNOSIS — L03115 Cellulitis of right lower limb: Secondary | ICD-10-CM | POA: Diagnosis not present

## 2018-02-09 DIAGNOSIS — F0281 Dementia in other diseases classified elsewhere with behavioral disturbance: Secondary | ICD-10-CM | POA: Diagnosis not present

## 2018-02-10 DIAGNOSIS — F0281 Dementia in other diseases classified elsewhere with behavioral disturbance: Secondary | ICD-10-CM | POA: Diagnosis not present

## 2018-02-10 DIAGNOSIS — G4700 Insomnia, unspecified: Secondary | ICD-10-CM | POA: Diagnosis not present

## 2018-02-10 DIAGNOSIS — G300 Alzheimer's disease with early onset: Secondary | ICD-10-CM | POA: Diagnosis not present

## 2018-02-10 DIAGNOSIS — L03115 Cellulitis of right lower limb: Secondary | ICD-10-CM | POA: Diagnosis not present

## 2018-02-10 DIAGNOSIS — N8110 Cystocele, unspecified: Secondary | ICD-10-CM | POA: Diagnosis not present

## 2018-02-10 DIAGNOSIS — F419 Anxiety disorder, unspecified: Secondary | ICD-10-CM | POA: Diagnosis not present

## 2018-02-11 DIAGNOSIS — N8110 Cystocele, unspecified: Secondary | ICD-10-CM | POA: Diagnosis not present

## 2018-02-11 DIAGNOSIS — G4700 Insomnia, unspecified: Secondary | ICD-10-CM | POA: Diagnosis not present

## 2018-02-11 DIAGNOSIS — F0281 Dementia in other diseases classified elsewhere with behavioral disturbance: Secondary | ICD-10-CM | POA: Diagnosis not present

## 2018-02-11 DIAGNOSIS — L03115 Cellulitis of right lower limb: Secondary | ICD-10-CM | POA: Diagnosis not present

## 2018-02-11 DIAGNOSIS — G300 Alzheimer's disease with early onset: Secondary | ICD-10-CM | POA: Diagnosis not present

## 2018-02-11 DIAGNOSIS — F419 Anxiety disorder, unspecified: Secondary | ICD-10-CM | POA: Diagnosis not present

## 2018-02-16 DIAGNOSIS — G4700 Insomnia, unspecified: Secondary | ICD-10-CM | POA: Diagnosis not present

## 2018-02-16 DIAGNOSIS — N8110 Cystocele, unspecified: Secondary | ICD-10-CM | POA: Diagnosis not present

## 2018-02-16 DIAGNOSIS — F419 Anxiety disorder, unspecified: Secondary | ICD-10-CM | POA: Diagnosis not present

## 2018-02-16 DIAGNOSIS — L03115 Cellulitis of right lower limb: Secondary | ICD-10-CM | POA: Diagnosis not present

## 2018-02-16 DIAGNOSIS — G300 Alzheimer's disease with early onset: Secondary | ICD-10-CM | POA: Diagnosis not present

## 2018-02-16 DIAGNOSIS — F0281 Dementia in other diseases classified elsewhere with behavioral disturbance: Secondary | ICD-10-CM | POA: Diagnosis not present

## 2018-02-17 DIAGNOSIS — G4700 Insomnia, unspecified: Secondary | ICD-10-CM | POA: Diagnosis not present

## 2018-02-17 DIAGNOSIS — G300 Alzheimer's disease with early onset: Secondary | ICD-10-CM | POA: Diagnosis not present

## 2018-02-17 DIAGNOSIS — N8110 Cystocele, unspecified: Secondary | ICD-10-CM | POA: Diagnosis not present

## 2018-02-17 DIAGNOSIS — L03115 Cellulitis of right lower limb: Secondary | ICD-10-CM | POA: Diagnosis not present

## 2018-02-17 DIAGNOSIS — F0281 Dementia in other diseases classified elsewhere with behavioral disturbance: Secondary | ICD-10-CM | POA: Diagnosis not present

## 2018-02-17 DIAGNOSIS — F419 Anxiety disorder, unspecified: Secondary | ICD-10-CM | POA: Diagnosis not present

## 2018-02-18 DIAGNOSIS — G300 Alzheimer's disease with early onset: Secondary | ICD-10-CM | POA: Diagnosis not present

## 2018-02-18 DIAGNOSIS — F419 Anxiety disorder, unspecified: Secondary | ICD-10-CM | POA: Diagnosis not present

## 2018-02-18 DIAGNOSIS — L03115 Cellulitis of right lower limb: Secondary | ICD-10-CM | POA: Diagnosis not present

## 2018-02-18 DIAGNOSIS — F0281 Dementia in other diseases classified elsewhere with behavioral disturbance: Secondary | ICD-10-CM | POA: Diagnosis not present

## 2018-02-18 DIAGNOSIS — G4700 Insomnia, unspecified: Secondary | ICD-10-CM | POA: Diagnosis not present

## 2018-02-18 DIAGNOSIS — N8110 Cystocele, unspecified: Secondary | ICD-10-CM | POA: Diagnosis not present

## 2018-02-19 DIAGNOSIS — L03115 Cellulitis of right lower limb: Secondary | ICD-10-CM | POA: Diagnosis not present

## 2018-02-19 DIAGNOSIS — F419 Anxiety disorder, unspecified: Secondary | ICD-10-CM | POA: Diagnosis not present

## 2018-02-19 DIAGNOSIS — G4700 Insomnia, unspecified: Secondary | ICD-10-CM | POA: Diagnosis not present

## 2018-02-19 DIAGNOSIS — F0281 Dementia in other diseases classified elsewhere with behavioral disturbance: Secondary | ICD-10-CM | POA: Diagnosis not present

## 2018-02-19 DIAGNOSIS — G300 Alzheimer's disease with early onset: Secondary | ICD-10-CM | POA: Diagnosis not present

## 2018-02-19 DIAGNOSIS — N8110 Cystocele, unspecified: Secondary | ICD-10-CM | POA: Diagnosis not present

## 2018-02-20 DIAGNOSIS — F0281 Dementia in other diseases classified elsewhere with behavioral disturbance: Secondary | ICD-10-CM | POA: Diagnosis not present

## 2018-02-20 DIAGNOSIS — G300 Alzheimer's disease with early onset: Secondary | ICD-10-CM | POA: Diagnosis not present

## 2018-02-20 DIAGNOSIS — G4700 Insomnia, unspecified: Secondary | ICD-10-CM | POA: Diagnosis not present

## 2018-02-20 DIAGNOSIS — L03115 Cellulitis of right lower limb: Secondary | ICD-10-CM | POA: Diagnosis not present

## 2018-02-20 DIAGNOSIS — N8110 Cystocele, unspecified: Secondary | ICD-10-CM | POA: Diagnosis not present

## 2018-02-20 DIAGNOSIS — F419 Anxiety disorder, unspecified: Secondary | ICD-10-CM | POA: Diagnosis not present

## 2018-02-23 DIAGNOSIS — G300 Alzheimer's disease with early onset: Secondary | ICD-10-CM | POA: Diagnosis not present

## 2018-02-23 DIAGNOSIS — F419 Anxiety disorder, unspecified: Secondary | ICD-10-CM | POA: Diagnosis not present

## 2018-02-23 DIAGNOSIS — G4700 Insomnia, unspecified: Secondary | ICD-10-CM | POA: Diagnosis not present

## 2018-02-23 DIAGNOSIS — L03115 Cellulitis of right lower limb: Secondary | ICD-10-CM | POA: Diagnosis not present

## 2018-02-23 DIAGNOSIS — F0281 Dementia in other diseases classified elsewhere with behavioral disturbance: Secondary | ICD-10-CM | POA: Diagnosis not present

## 2018-02-23 DIAGNOSIS — N8110 Cystocele, unspecified: Secondary | ICD-10-CM | POA: Diagnosis not present

## 2018-02-25 DIAGNOSIS — N8110 Cystocele, unspecified: Secondary | ICD-10-CM | POA: Diagnosis not present

## 2018-02-25 DIAGNOSIS — G300 Alzheimer's disease with early onset: Secondary | ICD-10-CM | POA: Diagnosis not present

## 2018-02-25 DIAGNOSIS — F419 Anxiety disorder, unspecified: Secondary | ICD-10-CM | POA: Diagnosis not present

## 2018-02-25 DIAGNOSIS — F0281 Dementia in other diseases classified elsewhere with behavioral disturbance: Secondary | ICD-10-CM | POA: Diagnosis not present

## 2018-02-25 DIAGNOSIS — G4700 Insomnia, unspecified: Secondary | ICD-10-CM | POA: Diagnosis not present

## 2018-02-25 DIAGNOSIS — L03115 Cellulitis of right lower limb: Secondary | ICD-10-CM | POA: Diagnosis not present

## 2018-02-27 DIAGNOSIS — L03115 Cellulitis of right lower limb: Secondary | ICD-10-CM | POA: Diagnosis not present

## 2018-02-27 DIAGNOSIS — G300 Alzheimer's disease with early onset: Secondary | ICD-10-CM | POA: Diagnosis not present

## 2018-02-27 DIAGNOSIS — N8110 Cystocele, unspecified: Secondary | ICD-10-CM | POA: Diagnosis not present

## 2018-02-27 DIAGNOSIS — F0281 Dementia in other diseases classified elsewhere with behavioral disturbance: Secondary | ICD-10-CM | POA: Diagnosis not present

## 2018-02-27 DIAGNOSIS — F419 Anxiety disorder, unspecified: Secondary | ICD-10-CM | POA: Diagnosis not present

## 2018-02-27 DIAGNOSIS — G4700 Insomnia, unspecified: Secondary | ICD-10-CM | POA: Diagnosis not present

## 2018-03-02 DIAGNOSIS — G300 Alzheimer's disease with early onset: Secondary | ICD-10-CM | POA: Diagnosis not present

## 2018-03-02 DIAGNOSIS — L03115 Cellulitis of right lower limb: Secondary | ICD-10-CM | POA: Diagnosis not present

## 2018-03-02 DIAGNOSIS — N8110 Cystocele, unspecified: Secondary | ICD-10-CM | POA: Diagnosis not present

## 2018-03-02 DIAGNOSIS — F419 Anxiety disorder, unspecified: Secondary | ICD-10-CM | POA: Diagnosis not present

## 2018-03-02 DIAGNOSIS — F0281 Dementia in other diseases classified elsewhere with behavioral disturbance: Secondary | ICD-10-CM | POA: Diagnosis not present

## 2018-03-02 DIAGNOSIS — G4700 Insomnia, unspecified: Secondary | ICD-10-CM | POA: Diagnosis not present

## 2018-03-03 DIAGNOSIS — N8110 Cystocele, unspecified: Secondary | ICD-10-CM | POA: Diagnosis not present

## 2018-03-03 DIAGNOSIS — M069 Rheumatoid arthritis, unspecified: Secondary | ICD-10-CM | POA: Diagnosis not present

## 2018-03-03 DIAGNOSIS — E039 Hypothyroidism, unspecified: Secondary | ICD-10-CM | POA: Diagnosis not present

## 2018-03-03 DIAGNOSIS — F0281 Dementia in other diseases classified elsewhere with behavioral disturbance: Secondary | ICD-10-CM | POA: Diagnosis not present

## 2018-03-03 DIAGNOSIS — L03115 Cellulitis of right lower limb: Secondary | ICD-10-CM | POA: Diagnosis not present

## 2018-03-03 DIAGNOSIS — Z960 Presence of urogenital implants: Secondary | ICD-10-CM | POA: Diagnosis not present

## 2018-03-03 DIAGNOSIS — M21611 Bunion of right foot: Secondary | ICD-10-CM | POA: Diagnosis not present

## 2018-03-03 DIAGNOSIS — F419 Anxiety disorder, unspecified: Secondary | ICD-10-CM | POA: Diagnosis not present

## 2018-03-03 DIAGNOSIS — I10 Essential (primary) hypertension: Secondary | ICD-10-CM | POA: Diagnosis not present

## 2018-03-03 DIAGNOSIS — E538 Deficiency of other specified B group vitamins: Secondary | ICD-10-CM | POA: Diagnosis not present

## 2018-03-03 DIAGNOSIS — M810 Age-related osteoporosis without current pathological fracture: Secondary | ICD-10-CM | POA: Diagnosis not present

## 2018-03-03 DIAGNOSIS — E876 Hypokalemia: Secondary | ICD-10-CM | POA: Diagnosis not present

## 2018-03-03 DIAGNOSIS — G300 Alzheimer's disease with early onset: Secondary | ICD-10-CM | POA: Diagnosis not present

## 2018-03-03 DIAGNOSIS — L509 Urticaria, unspecified: Secondary | ICD-10-CM | POA: Diagnosis not present

## 2018-03-03 DIAGNOSIS — G4700 Insomnia, unspecified: Secondary | ICD-10-CM | POA: Diagnosis not present

## 2018-03-04 DIAGNOSIS — G4700 Insomnia, unspecified: Secondary | ICD-10-CM | POA: Diagnosis not present

## 2018-03-04 DIAGNOSIS — N8110 Cystocele, unspecified: Secondary | ICD-10-CM | POA: Diagnosis not present

## 2018-03-04 DIAGNOSIS — G300 Alzheimer's disease with early onset: Secondary | ICD-10-CM | POA: Diagnosis not present

## 2018-03-04 DIAGNOSIS — F419 Anxiety disorder, unspecified: Secondary | ICD-10-CM | POA: Diagnosis not present

## 2018-03-04 DIAGNOSIS — L03115 Cellulitis of right lower limb: Secondary | ICD-10-CM | POA: Diagnosis not present

## 2018-03-04 DIAGNOSIS — F0281 Dementia in other diseases classified elsewhere with behavioral disturbance: Secondary | ICD-10-CM | POA: Diagnosis not present

## 2018-03-06 DIAGNOSIS — G300 Alzheimer's disease with early onset: Secondary | ICD-10-CM | POA: Diagnosis not present

## 2018-03-06 DIAGNOSIS — F0281 Dementia in other diseases classified elsewhere with behavioral disturbance: Secondary | ICD-10-CM | POA: Diagnosis not present

## 2018-03-06 DIAGNOSIS — F419 Anxiety disorder, unspecified: Secondary | ICD-10-CM | POA: Diagnosis not present

## 2018-03-06 DIAGNOSIS — L03115 Cellulitis of right lower limb: Secondary | ICD-10-CM | POA: Diagnosis not present

## 2018-03-06 DIAGNOSIS — G4700 Insomnia, unspecified: Secondary | ICD-10-CM | POA: Diagnosis not present

## 2018-03-06 DIAGNOSIS — N8110 Cystocele, unspecified: Secondary | ICD-10-CM | POA: Diagnosis not present

## 2018-03-07 DIAGNOSIS — F0281 Dementia in other diseases classified elsewhere with behavioral disturbance: Secondary | ICD-10-CM | POA: Diagnosis not present

## 2018-03-07 DIAGNOSIS — G4700 Insomnia, unspecified: Secondary | ICD-10-CM | POA: Diagnosis not present

## 2018-03-07 DIAGNOSIS — N8110 Cystocele, unspecified: Secondary | ICD-10-CM | POA: Diagnosis not present

## 2018-03-07 DIAGNOSIS — L03115 Cellulitis of right lower limb: Secondary | ICD-10-CM | POA: Diagnosis not present

## 2018-03-07 DIAGNOSIS — G300 Alzheimer's disease with early onset: Secondary | ICD-10-CM | POA: Diagnosis not present

## 2018-03-07 DIAGNOSIS — F419 Anxiety disorder, unspecified: Secondary | ICD-10-CM | POA: Diagnosis not present

## 2018-03-09 DIAGNOSIS — F419 Anxiety disorder, unspecified: Secondary | ICD-10-CM | POA: Diagnosis not present

## 2018-03-09 DIAGNOSIS — G300 Alzheimer's disease with early onset: Secondary | ICD-10-CM | POA: Diagnosis not present

## 2018-03-09 DIAGNOSIS — N8110 Cystocele, unspecified: Secondary | ICD-10-CM | POA: Diagnosis not present

## 2018-03-09 DIAGNOSIS — F0281 Dementia in other diseases classified elsewhere with behavioral disturbance: Secondary | ICD-10-CM | POA: Diagnosis not present

## 2018-03-09 DIAGNOSIS — G4700 Insomnia, unspecified: Secondary | ICD-10-CM | POA: Diagnosis not present

## 2018-03-09 DIAGNOSIS — L03115 Cellulitis of right lower limb: Secondary | ICD-10-CM | POA: Diagnosis not present

## 2018-03-11 DIAGNOSIS — F419 Anxiety disorder, unspecified: Secondary | ICD-10-CM | POA: Diagnosis not present

## 2018-03-11 DIAGNOSIS — G4700 Insomnia, unspecified: Secondary | ICD-10-CM | POA: Diagnosis not present

## 2018-03-11 DIAGNOSIS — L03115 Cellulitis of right lower limb: Secondary | ICD-10-CM | POA: Diagnosis not present

## 2018-03-11 DIAGNOSIS — G300 Alzheimer's disease with early onset: Secondary | ICD-10-CM | POA: Diagnosis not present

## 2018-03-11 DIAGNOSIS — N8110 Cystocele, unspecified: Secondary | ICD-10-CM | POA: Diagnosis not present

## 2018-03-11 DIAGNOSIS — F0281 Dementia in other diseases classified elsewhere with behavioral disturbance: Secondary | ICD-10-CM | POA: Diagnosis not present

## 2018-03-13 DIAGNOSIS — N8110 Cystocele, unspecified: Secondary | ICD-10-CM | POA: Diagnosis not present

## 2018-03-13 DIAGNOSIS — F0281 Dementia in other diseases classified elsewhere with behavioral disturbance: Secondary | ICD-10-CM | POA: Diagnosis not present

## 2018-03-13 DIAGNOSIS — L03115 Cellulitis of right lower limb: Secondary | ICD-10-CM | POA: Diagnosis not present

## 2018-03-13 DIAGNOSIS — G4700 Insomnia, unspecified: Secondary | ICD-10-CM | POA: Diagnosis not present

## 2018-03-13 DIAGNOSIS — G300 Alzheimer's disease with early onset: Secondary | ICD-10-CM | POA: Diagnosis not present

## 2018-03-13 DIAGNOSIS — F419 Anxiety disorder, unspecified: Secondary | ICD-10-CM | POA: Diagnosis not present

## 2018-03-16 DIAGNOSIS — G4700 Insomnia, unspecified: Secondary | ICD-10-CM | POA: Diagnosis not present

## 2018-03-16 DIAGNOSIS — G300 Alzheimer's disease with early onset: Secondary | ICD-10-CM | POA: Diagnosis not present

## 2018-03-16 DIAGNOSIS — N8110 Cystocele, unspecified: Secondary | ICD-10-CM | POA: Diagnosis not present

## 2018-03-16 DIAGNOSIS — F419 Anxiety disorder, unspecified: Secondary | ICD-10-CM | POA: Diagnosis not present

## 2018-03-16 DIAGNOSIS — F0281 Dementia in other diseases classified elsewhere with behavioral disturbance: Secondary | ICD-10-CM | POA: Diagnosis not present

## 2018-03-16 DIAGNOSIS — L03115 Cellulitis of right lower limb: Secondary | ICD-10-CM | POA: Diagnosis not present

## 2018-03-18 DIAGNOSIS — L03115 Cellulitis of right lower limb: Secondary | ICD-10-CM | POA: Diagnosis not present

## 2018-03-18 DIAGNOSIS — G4700 Insomnia, unspecified: Secondary | ICD-10-CM | POA: Diagnosis not present

## 2018-03-18 DIAGNOSIS — F419 Anxiety disorder, unspecified: Secondary | ICD-10-CM | POA: Diagnosis not present

## 2018-03-18 DIAGNOSIS — G300 Alzheimer's disease with early onset: Secondary | ICD-10-CM | POA: Diagnosis not present

## 2018-03-18 DIAGNOSIS — N8110 Cystocele, unspecified: Secondary | ICD-10-CM | POA: Diagnosis not present

## 2018-03-18 DIAGNOSIS — F0281 Dementia in other diseases classified elsewhere with behavioral disturbance: Secondary | ICD-10-CM | POA: Diagnosis not present

## 2018-03-20 DIAGNOSIS — L03115 Cellulitis of right lower limb: Secondary | ICD-10-CM | POA: Diagnosis not present

## 2018-03-20 DIAGNOSIS — F419 Anxiety disorder, unspecified: Secondary | ICD-10-CM | POA: Diagnosis not present

## 2018-03-20 DIAGNOSIS — N8110 Cystocele, unspecified: Secondary | ICD-10-CM | POA: Diagnosis not present

## 2018-03-20 DIAGNOSIS — G4700 Insomnia, unspecified: Secondary | ICD-10-CM | POA: Diagnosis not present

## 2018-03-20 DIAGNOSIS — F0281 Dementia in other diseases classified elsewhere with behavioral disturbance: Secondary | ICD-10-CM | POA: Diagnosis not present

## 2018-03-20 DIAGNOSIS — G300 Alzheimer's disease with early onset: Secondary | ICD-10-CM | POA: Diagnosis not present

## 2018-03-23 DIAGNOSIS — F419 Anxiety disorder, unspecified: Secondary | ICD-10-CM | POA: Diagnosis not present

## 2018-03-23 DIAGNOSIS — G300 Alzheimer's disease with early onset: Secondary | ICD-10-CM | POA: Diagnosis not present

## 2018-03-23 DIAGNOSIS — N8110 Cystocele, unspecified: Secondary | ICD-10-CM | POA: Diagnosis not present

## 2018-03-23 DIAGNOSIS — G4700 Insomnia, unspecified: Secondary | ICD-10-CM | POA: Diagnosis not present

## 2018-03-23 DIAGNOSIS — L03115 Cellulitis of right lower limb: Secondary | ICD-10-CM | POA: Diagnosis not present

## 2018-03-23 DIAGNOSIS — F0281 Dementia in other diseases classified elsewhere with behavioral disturbance: Secondary | ICD-10-CM | POA: Diagnosis not present

## 2018-03-25 DIAGNOSIS — L03115 Cellulitis of right lower limb: Secondary | ICD-10-CM | POA: Diagnosis not present

## 2018-03-25 DIAGNOSIS — F0281 Dementia in other diseases classified elsewhere with behavioral disturbance: Secondary | ICD-10-CM | POA: Diagnosis not present

## 2018-03-25 DIAGNOSIS — F419 Anxiety disorder, unspecified: Secondary | ICD-10-CM | POA: Diagnosis not present

## 2018-03-25 DIAGNOSIS — G300 Alzheimer's disease with early onset: Secondary | ICD-10-CM | POA: Diagnosis not present

## 2018-03-25 DIAGNOSIS — G4700 Insomnia, unspecified: Secondary | ICD-10-CM | POA: Diagnosis not present

## 2018-03-25 DIAGNOSIS — N8110 Cystocele, unspecified: Secondary | ICD-10-CM | POA: Diagnosis not present

## 2018-03-26 DIAGNOSIS — F0281 Dementia in other diseases classified elsewhere with behavioral disturbance: Secondary | ICD-10-CM | POA: Diagnosis not present

## 2018-03-26 DIAGNOSIS — F419 Anxiety disorder, unspecified: Secondary | ICD-10-CM | POA: Diagnosis not present

## 2018-03-26 DIAGNOSIS — G4700 Insomnia, unspecified: Secondary | ICD-10-CM | POA: Diagnosis not present

## 2018-03-26 DIAGNOSIS — L03115 Cellulitis of right lower limb: Secondary | ICD-10-CM | POA: Diagnosis not present

## 2018-03-26 DIAGNOSIS — G300 Alzheimer's disease with early onset: Secondary | ICD-10-CM | POA: Diagnosis not present

## 2018-03-26 DIAGNOSIS — N8110 Cystocele, unspecified: Secondary | ICD-10-CM | POA: Diagnosis not present

## 2018-03-27 DIAGNOSIS — F419 Anxiety disorder, unspecified: Secondary | ICD-10-CM | POA: Diagnosis not present

## 2018-03-27 DIAGNOSIS — F0281 Dementia in other diseases classified elsewhere with behavioral disturbance: Secondary | ICD-10-CM | POA: Diagnosis not present

## 2018-03-27 DIAGNOSIS — N8110 Cystocele, unspecified: Secondary | ICD-10-CM | POA: Diagnosis not present

## 2018-03-27 DIAGNOSIS — L03115 Cellulitis of right lower limb: Secondary | ICD-10-CM | POA: Diagnosis not present

## 2018-03-27 DIAGNOSIS — G300 Alzheimer's disease with early onset: Secondary | ICD-10-CM | POA: Diagnosis not present

## 2018-03-27 DIAGNOSIS — G4700 Insomnia, unspecified: Secondary | ICD-10-CM | POA: Diagnosis not present

## 2018-03-30 DIAGNOSIS — F419 Anxiety disorder, unspecified: Secondary | ICD-10-CM | POA: Diagnosis not present

## 2018-03-30 DIAGNOSIS — L03115 Cellulitis of right lower limb: Secondary | ICD-10-CM | POA: Diagnosis not present

## 2018-03-30 DIAGNOSIS — F0281 Dementia in other diseases classified elsewhere with behavioral disturbance: Secondary | ICD-10-CM | POA: Diagnosis not present

## 2018-03-30 DIAGNOSIS — N8110 Cystocele, unspecified: Secondary | ICD-10-CM | POA: Diagnosis not present

## 2018-03-30 DIAGNOSIS — G4700 Insomnia, unspecified: Secondary | ICD-10-CM | POA: Diagnosis not present

## 2018-03-30 DIAGNOSIS — G300 Alzheimer's disease with early onset: Secondary | ICD-10-CM | POA: Diagnosis not present

## 2018-04-01 DIAGNOSIS — G300 Alzheimer's disease with early onset: Secondary | ICD-10-CM | POA: Diagnosis not present

## 2018-04-01 DIAGNOSIS — G4700 Insomnia, unspecified: Secondary | ICD-10-CM | POA: Diagnosis not present

## 2018-04-01 DIAGNOSIS — F419 Anxiety disorder, unspecified: Secondary | ICD-10-CM | POA: Diagnosis not present

## 2018-04-01 DIAGNOSIS — F0281 Dementia in other diseases classified elsewhere with behavioral disturbance: Secondary | ICD-10-CM | POA: Diagnosis not present

## 2018-04-01 DIAGNOSIS — N8110 Cystocele, unspecified: Secondary | ICD-10-CM | POA: Diagnosis not present

## 2018-04-01 DIAGNOSIS — L03115 Cellulitis of right lower limb: Secondary | ICD-10-CM | POA: Diagnosis not present

## 2018-04-03 DIAGNOSIS — L03115 Cellulitis of right lower limb: Secondary | ICD-10-CM | POA: Diagnosis not present

## 2018-04-03 DIAGNOSIS — F419 Anxiety disorder, unspecified: Secondary | ICD-10-CM | POA: Diagnosis not present

## 2018-04-03 DIAGNOSIS — G300 Alzheimer's disease with early onset: Secondary | ICD-10-CM | POA: Diagnosis not present

## 2018-04-03 DIAGNOSIS — E876 Hypokalemia: Secondary | ICD-10-CM | POA: Diagnosis not present

## 2018-04-03 DIAGNOSIS — I10 Essential (primary) hypertension: Secondary | ICD-10-CM | POA: Diagnosis not present

## 2018-04-03 DIAGNOSIS — M069 Rheumatoid arthritis, unspecified: Secondary | ICD-10-CM | POA: Diagnosis not present

## 2018-04-03 DIAGNOSIS — E538 Deficiency of other specified B group vitamins: Secondary | ICD-10-CM | POA: Diagnosis not present

## 2018-04-03 DIAGNOSIS — F0281 Dementia in other diseases classified elsewhere with behavioral disturbance: Secondary | ICD-10-CM | POA: Diagnosis not present

## 2018-04-03 DIAGNOSIS — L509 Urticaria, unspecified: Secondary | ICD-10-CM | POA: Diagnosis not present

## 2018-04-03 DIAGNOSIS — E039 Hypothyroidism, unspecified: Secondary | ICD-10-CM | POA: Diagnosis not present

## 2018-04-03 DIAGNOSIS — N8110 Cystocele, unspecified: Secondary | ICD-10-CM | POA: Diagnosis not present

## 2018-04-03 DIAGNOSIS — Z960 Presence of urogenital implants: Secondary | ICD-10-CM | POA: Diagnosis not present

## 2018-04-03 DIAGNOSIS — M21611 Bunion of right foot: Secondary | ICD-10-CM | POA: Diagnosis not present

## 2018-04-03 DIAGNOSIS — G4700 Insomnia, unspecified: Secondary | ICD-10-CM | POA: Diagnosis not present

## 2018-04-03 DIAGNOSIS — M810 Age-related osteoporosis without current pathological fracture: Secondary | ICD-10-CM | POA: Diagnosis not present

## 2018-04-07 DIAGNOSIS — F0281 Dementia in other diseases classified elsewhere with behavioral disturbance: Secondary | ICD-10-CM | POA: Diagnosis not present

## 2018-04-07 DIAGNOSIS — N8110 Cystocele, unspecified: Secondary | ICD-10-CM | POA: Diagnosis not present

## 2018-04-07 DIAGNOSIS — L03115 Cellulitis of right lower limb: Secondary | ICD-10-CM | POA: Diagnosis not present

## 2018-04-07 DIAGNOSIS — F419 Anxiety disorder, unspecified: Secondary | ICD-10-CM | POA: Diagnosis not present

## 2018-04-07 DIAGNOSIS — G300 Alzheimer's disease with early onset: Secondary | ICD-10-CM | POA: Diagnosis not present

## 2018-04-07 DIAGNOSIS — G4700 Insomnia, unspecified: Secondary | ICD-10-CM | POA: Diagnosis not present

## 2018-04-08 DIAGNOSIS — F0281 Dementia in other diseases classified elsewhere with behavioral disturbance: Secondary | ICD-10-CM | POA: Diagnosis not present

## 2018-04-08 DIAGNOSIS — N8110 Cystocele, unspecified: Secondary | ICD-10-CM | POA: Diagnosis not present

## 2018-04-08 DIAGNOSIS — L03115 Cellulitis of right lower limb: Secondary | ICD-10-CM | POA: Diagnosis not present

## 2018-04-08 DIAGNOSIS — G4700 Insomnia, unspecified: Secondary | ICD-10-CM | POA: Diagnosis not present

## 2018-04-08 DIAGNOSIS — G300 Alzheimer's disease with early onset: Secondary | ICD-10-CM | POA: Diagnosis not present

## 2018-04-08 DIAGNOSIS — F419 Anxiety disorder, unspecified: Secondary | ICD-10-CM | POA: Diagnosis not present

## 2018-04-10 DIAGNOSIS — G4700 Insomnia, unspecified: Secondary | ICD-10-CM | POA: Diagnosis not present

## 2018-04-10 DIAGNOSIS — F419 Anxiety disorder, unspecified: Secondary | ICD-10-CM | POA: Diagnosis not present

## 2018-04-10 DIAGNOSIS — L03115 Cellulitis of right lower limb: Secondary | ICD-10-CM | POA: Diagnosis not present

## 2018-04-10 DIAGNOSIS — N8110 Cystocele, unspecified: Secondary | ICD-10-CM | POA: Diagnosis not present

## 2018-04-10 DIAGNOSIS — G300 Alzheimer's disease with early onset: Secondary | ICD-10-CM | POA: Diagnosis not present

## 2018-04-10 DIAGNOSIS — F0281 Dementia in other diseases classified elsewhere with behavioral disturbance: Secondary | ICD-10-CM | POA: Diagnosis not present

## 2018-04-13 DIAGNOSIS — F419 Anxiety disorder, unspecified: Secondary | ICD-10-CM | POA: Diagnosis not present

## 2018-04-13 DIAGNOSIS — F0281 Dementia in other diseases classified elsewhere with behavioral disturbance: Secondary | ICD-10-CM | POA: Diagnosis not present

## 2018-04-13 DIAGNOSIS — G4700 Insomnia, unspecified: Secondary | ICD-10-CM | POA: Diagnosis not present

## 2018-04-13 DIAGNOSIS — N8110 Cystocele, unspecified: Secondary | ICD-10-CM | POA: Diagnosis not present

## 2018-04-13 DIAGNOSIS — G300 Alzheimer's disease with early onset: Secondary | ICD-10-CM | POA: Diagnosis not present

## 2018-04-13 DIAGNOSIS — L03115 Cellulitis of right lower limb: Secondary | ICD-10-CM | POA: Diagnosis not present

## 2018-04-15 DIAGNOSIS — G300 Alzheimer's disease with early onset: Secondary | ICD-10-CM | POA: Diagnosis not present

## 2018-04-15 DIAGNOSIS — L03115 Cellulitis of right lower limb: Secondary | ICD-10-CM | POA: Diagnosis not present

## 2018-04-15 DIAGNOSIS — G4700 Insomnia, unspecified: Secondary | ICD-10-CM | POA: Diagnosis not present

## 2018-04-15 DIAGNOSIS — F0281 Dementia in other diseases classified elsewhere with behavioral disturbance: Secondary | ICD-10-CM | POA: Diagnosis not present

## 2018-04-15 DIAGNOSIS — F419 Anxiety disorder, unspecified: Secondary | ICD-10-CM | POA: Diagnosis not present

## 2018-04-15 DIAGNOSIS — N8110 Cystocele, unspecified: Secondary | ICD-10-CM | POA: Diagnosis not present

## 2018-04-17 DIAGNOSIS — N8110 Cystocele, unspecified: Secondary | ICD-10-CM | POA: Diagnosis not present

## 2018-04-17 DIAGNOSIS — F0281 Dementia in other diseases classified elsewhere with behavioral disturbance: Secondary | ICD-10-CM | POA: Diagnosis not present

## 2018-04-17 DIAGNOSIS — G4700 Insomnia, unspecified: Secondary | ICD-10-CM | POA: Diagnosis not present

## 2018-04-17 DIAGNOSIS — F419 Anxiety disorder, unspecified: Secondary | ICD-10-CM | POA: Diagnosis not present

## 2018-04-17 DIAGNOSIS — G300 Alzheimer's disease with early onset: Secondary | ICD-10-CM | POA: Diagnosis not present

## 2018-04-17 DIAGNOSIS — L03115 Cellulitis of right lower limb: Secondary | ICD-10-CM | POA: Diagnosis not present

## 2018-04-19 DIAGNOSIS — G4700 Insomnia, unspecified: Secondary | ICD-10-CM | POA: Diagnosis not present

## 2018-04-19 DIAGNOSIS — L03115 Cellulitis of right lower limb: Secondary | ICD-10-CM | POA: Diagnosis not present

## 2018-04-19 DIAGNOSIS — N8110 Cystocele, unspecified: Secondary | ICD-10-CM | POA: Diagnosis not present

## 2018-04-19 DIAGNOSIS — F0281 Dementia in other diseases classified elsewhere with behavioral disturbance: Secondary | ICD-10-CM | POA: Diagnosis not present

## 2018-04-19 DIAGNOSIS — G300 Alzheimer's disease with early onset: Secondary | ICD-10-CM | POA: Diagnosis not present

## 2018-04-19 DIAGNOSIS — F419 Anxiety disorder, unspecified: Secondary | ICD-10-CM | POA: Diagnosis not present

## 2018-04-20 DIAGNOSIS — L03115 Cellulitis of right lower limb: Secondary | ICD-10-CM | POA: Diagnosis not present

## 2018-04-20 DIAGNOSIS — N8110 Cystocele, unspecified: Secondary | ICD-10-CM | POA: Diagnosis not present

## 2018-04-20 DIAGNOSIS — F0281 Dementia in other diseases classified elsewhere with behavioral disturbance: Secondary | ICD-10-CM | POA: Diagnosis not present

## 2018-04-20 DIAGNOSIS — G300 Alzheimer's disease with early onset: Secondary | ICD-10-CM | POA: Diagnosis not present

## 2018-04-20 DIAGNOSIS — F419 Anxiety disorder, unspecified: Secondary | ICD-10-CM | POA: Diagnosis not present

## 2018-04-20 DIAGNOSIS — G4700 Insomnia, unspecified: Secondary | ICD-10-CM | POA: Diagnosis not present

## 2018-04-22 DIAGNOSIS — L03115 Cellulitis of right lower limb: Secondary | ICD-10-CM | POA: Diagnosis not present

## 2018-04-22 DIAGNOSIS — F0281 Dementia in other diseases classified elsewhere with behavioral disturbance: Secondary | ICD-10-CM | POA: Diagnosis not present

## 2018-04-22 DIAGNOSIS — F419 Anxiety disorder, unspecified: Secondary | ICD-10-CM | POA: Diagnosis not present

## 2018-04-22 DIAGNOSIS — G4700 Insomnia, unspecified: Secondary | ICD-10-CM | POA: Diagnosis not present

## 2018-04-22 DIAGNOSIS — N8110 Cystocele, unspecified: Secondary | ICD-10-CM | POA: Diagnosis not present

## 2018-04-22 DIAGNOSIS — G300 Alzheimer's disease with early onset: Secondary | ICD-10-CM | POA: Diagnosis not present

## 2018-04-24 DIAGNOSIS — G4700 Insomnia, unspecified: Secondary | ICD-10-CM | POA: Diagnosis not present

## 2018-04-24 DIAGNOSIS — F419 Anxiety disorder, unspecified: Secondary | ICD-10-CM | POA: Diagnosis not present

## 2018-04-24 DIAGNOSIS — N8110 Cystocele, unspecified: Secondary | ICD-10-CM | POA: Diagnosis not present

## 2018-04-24 DIAGNOSIS — F0281 Dementia in other diseases classified elsewhere with behavioral disturbance: Secondary | ICD-10-CM | POA: Diagnosis not present

## 2018-04-24 DIAGNOSIS — L03115 Cellulitis of right lower limb: Secondary | ICD-10-CM | POA: Diagnosis not present

## 2018-04-24 DIAGNOSIS — G300 Alzheimer's disease with early onset: Secondary | ICD-10-CM | POA: Diagnosis not present

## 2018-04-27 DIAGNOSIS — G300 Alzheimer's disease with early onset: Secondary | ICD-10-CM | POA: Diagnosis not present

## 2018-04-27 DIAGNOSIS — F419 Anxiety disorder, unspecified: Secondary | ICD-10-CM | POA: Diagnosis not present

## 2018-04-27 DIAGNOSIS — G4700 Insomnia, unspecified: Secondary | ICD-10-CM | POA: Diagnosis not present

## 2018-04-27 DIAGNOSIS — N8110 Cystocele, unspecified: Secondary | ICD-10-CM | POA: Diagnosis not present

## 2018-04-27 DIAGNOSIS — L03115 Cellulitis of right lower limb: Secondary | ICD-10-CM | POA: Diagnosis not present

## 2018-04-27 DIAGNOSIS — F0281 Dementia in other diseases classified elsewhere with behavioral disturbance: Secondary | ICD-10-CM | POA: Diagnosis not present

## 2018-04-28 DIAGNOSIS — L03115 Cellulitis of right lower limb: Secondary | ICD-10-CM | POA: Diagnosis not present

## 2018-04-28 DIAGNOSIS — N8110 Cystocele, unspecified: Secondary | ICD-10-CM | POA: Diagnosis not present

## 2018-04-28 DIAGNOSIS — F0281 Dementia in other diseases classified elsewhere with behavioral disturbance: Secondary | ICD-10-CM | POA: Diagnosis not present

## 2018-04-28 DIAGNOSIS — G300 Alzheimer's disease with early onset: Secondary | ICD-10-CM | POA: Diagnosis not present

## 2018-04-28 DIAGNOSIS — F419 Anxiety disorder, unspecified: Secondary | ICD-10-CM | POA: Diagnosis not present

## 2018-04-28 DIAGNOSIS — G4700 Insomnia, unspecified: Secondary | ICD-10-CM | POA: Diagnosis not present

## 2018-04-29 DIAGNOSIS — F0281 Dementia in other diseases classified elsewhere with behavioral disturbance: Secondary | ICD-10-CM | POA: Diagnosis not present

## 2018-04-29 DIAGNOSIS — N8110 Cystocele, unspecified: Secondary | ICD-10-CM | POA: Diagnosis not present

## 2018-04-29 DIAGNOSIS — G4700 Insomnia, unspecified: Secondary | ICD-10-CM | POA: Diagnosis not present

## 2018-04-29 DIAGNOSIS — L03115 Cellulitis of right lower limb: Secondary | ICD-10-CM | POA: Diagnosis not present

## 2018-04-29 DIAGNOSIS — G300 Alzheimer's disease with early onset: Secondary | ICD-10-CM | POA: Diagnosis not present

## 2018-04-29 DIAGNOSIS — F419 Anxiety disorder, unspecified: Secondary | ICD-10-CM | POA: Diagnosis not present

## 2018-05-01 DIAGNOSIS — G4700 Insomnia, unspecified: Secondary | ICD-10-CM | POA: Diagnosis not present

## 2018-05-01 DIAGNOSIS — F419 Anxiety disorder, unspecified: Secondary | ICD-10-CM | POA: Diagnosis not present

## 2018-05-01 DIAGNOSIS — N8110 Cystocele, unspecified: Secondary | ICD-10-CM | POA: Diagnosis not present

## 2018-05-01 DIAGNOSIS — L03115 Cellulitis of right lower limb: Secondary | ICD-10-CM | POA: Diagnosis not present

## 2018-05-01 DIAGNOSIS — G300 Alzheimer's disease with early onset: Secondary | ICD-10-CM | POA: Diagnosis not present

## 2018-05-01 DIAGNOSIS — F0281 Dementia in other diseases classified elsewhere with behavioral disturbance: Secondary | ICD-10-CM | POA: Diagnosis not present

## 2018-05-03 DIAGNOSIS — E538 Deficiency of other specified B group vitamins: Secondary | ICD-10-CM | POA: Diagnosis not present

## 2018-05-03 DIAGNOSIS — L03115 Cellulitis of right lower limb: Secondary | ICD-10-CM | POA: Diagnosis not present

## 2018-05-03 DIAGNOSIS — E876 Hypokalemia: Secondary | ICD-10-CM | POA: Diagnosis not present

## 2018-05-03 DIAGNOSIS — M810 Age-related osteoporosis without current pathological fracture: Secondary | ICD-10-CM | POA: Diagnosis not present

## 2018-05-03 DIAGNOSIS — N8110 Cystocele, unspecified: Secondary | ICD-10-CM | POA: Diagnosis not present

## 2018-05-03 DIAGNOSIS — I10 Essential (primary) hypertension: Secondary | ICD-10-CM | POA: Diagnosis not present

## 2018-05-03 DIAGNOSIS — G4700 Insomnia, unspecified: Secondary | ICD-10-CM | POA: Diagnosis not present

## 2018-05-03 DIAGNOSIS — L509 Urticaria, unspecified: Secondary | ICD-10-CM | POA: Diagnosis not present

## 2018-05-03 DIAGNOSIS — G300 Alzheimer's disease with early onset: Secondary | ICD-10-CM | POA: Diagnosis not present

## 2018-05-03 DIAGNOSIS — M21611 Bunion of right foot: Secondary | ICD-10-CM | POA: Diagnosis not present

## 2018-05-03 DIAGNOSIS — E039 Hypothyroidism, unspecified: Secondary | ICD-10-CM | POA: Diagnosis not present

## 2018-05-03 DIAGNOSIS — F419 Anxiety disorder, unspecified: Secondary | ICD-10-CM | POA: Diagnosis not present

## 2018-05-03 DIAGNOSIS — Z960 Presence of urogenital implants: Secondary | ICD-10-CM | POA: Diagnosis not present

## 2018-05-03 DIAGNOSIS — M069 Rheumatoid arthritis, unspecified: Secondary | ICD-10-CM | POA: Diagnosis not present

## 2018-05-03 DIAGNOSIS — F0281 Dementia in other diseases classified elsewhere with behavioral disturbance: Secondary | ICD-10-CM | POA: Diagnosis not present

## 2018-05-04 DIAGNOSIS — G4700 Insomnia, unspecified: Secondary | ICD-10-CM | POA: Diagnosis not present

## 2018-05-04 DIAGNOSIS — F0281 Dementia in other diseases classified elsewhere with behavioral disturbance: Secondary | ICD-10-CM | POA: Diagnosis not present

## 2018-05-04 DIAGNOSIS — L03115 Cellulitis of right lower limb: Secondary | ICD-10-CM | POA: Diagnosis not present

## 2018-05-04 DIAGNOSIS — G300 Alzheimer's disease with early onset: Secondary | ICD-10-CM | POA: Diagnosis not present

## 2018-05-04 DIAGNOSIS — N8110 Cystocele, unspecified: Secondary | ICD-10-CM | POA: Diagnosis not present

## 2018-05-04 DIAGNOSIS — F419 Anxiety disorder, unspecified: Secondary | ICD-10-CM | POA: Diagnosis not present

## 2018-05-05 DIAGNOSIS — G300 Alzheimer's disease with early onset: Secondary | ICD-10-CM | POA: Diagnosis not present

## 2018-05-05 DIAGNOSIS — F419 Anxiety disorder, unspecified: Secondary | ICD-10-CM | POA: Diagnosis not present

## 2018-05-05 DIAGNOSIS — L03115 Cellulitis of right lower limb: Secondary | ICD-10-CM | POA: Diagnosis not present

## 2018-05-05 DIAGNOSIS — F0281 Dementia in other diseases classified elsewhere with behavioral disturbance: Secondary | ICD-10-CM | POA: Diagnosis not present

## 2018-05-05 DIAGNOSIS — N8110 Cystocele, unspecified: Secondary | ICD-10-CM | POA: Diagnosis not present

## 2018-05-05 DIAGNOSIS — G4700 Insomnia, unspecified: Secondary | ICD-10-CM | POA: Diagnosis not present

## 2018-05-06 DIAGNOSIS — F419 Anxiety disorder, unspecified: Secondary | ICD-10-CM | POA: Diagnosis not present

## 2018-05-06 DIAGNOSIS — F0281 Dementia in other diseases classified elsewhere with behavioral disturbance: Secondary | ICD-10-CM | POA: Diagnosis not present

## 2018-05-06 DIAGNOSIS — G4700 Insomnia, unspecified: Secondary | ICD-10-CM | POA: Diagnosis not present

## 2018-05-06 DIAGNOSIS — L03115 Cellulitis of right lower limb: Secondary | ICD-10-CM | POA: Diagnosis not present

## 2018-05-06 DIAGNOSIS — G300 Alzheimer's disease with early onset: Secondary | ICD-10-CM | POA: Diagnosis not present

## 2018-05-06 DIAGNOSIS — N8110 Cystocele, unspecified: Secondary | ICD-10-CM | POA: Diagnosis not present

## 2018-05-08 DIAGNOSIS — L03115 Cellulitis of right lower limb: Secondary | ICD-10-CM | POA: Diagnosis not present

## 2018-05-08 DIAGNOSIS — F419 Anxiety disorder, unspecified: Secondary | ICD-10-CM | POA: Diagnosis not present

## 2018-05-08 DIAGNOSIS — N8110 Cystocele, unspecified: Secondary | ICD-10-CM | POA: Diagnosis not present

## 2018-05-08 DIAGNOSIS — G4700 Insomnia, unspecified: Secondary | ICD-10-CM | POA: Diagnosis not present

## 2018-05-08 DIAGNOSIS — F0281 Dementia in other diseases classified elsewhere with behavioral disturbance: Secondary | ICD-10-CM | POA: Diagnosis not present

## 2018-05-08 DIAGNOSIS — G300 Alzheimer's disease with early onset: Secondary | ICD-10-CM | POA: Diagnosis not present

## 2018-05-11 DIAGNOSIS — F0281 Dementia in other diseases classified elsewhere with behavioral disturbance: Secondary | ICD-10-CM | POA: Diagnosis not present

## 2018-05-11 DIAGNOSIS — G300 Alzheimer's disease with early onset: Secondary | ICD-10-CM | POA: Diagnosis not present

## 2018-05-11 DIAGNOSIS — G4700 Insomnia, unspecified: Secondary | ICD-10-CM | POA: Diagnosis not present

## 2018-05-11 DIAGNOSIS — L03115 Cellulitis of right lower limb: Secondary | ICD-10-CM | POA: Diagnosis not present

## 2018-05-11 DIAGNOSIS — F419 Anxiety disorder, unspecified: Secondary | ICD-10-CM | POA: Diagnosis not present

## 2018-05-11 DIAGNOSIS — N8110 Cystocele, unspecified: Secondary | ICD-10-CM | POA: Diagnosis not present

## 2018-05-12 DIAGNOSIS — L03115 Cellulitis of right lower limb: Secondary | ICD-10-CM | POA: Diagnosis not present

## 2018-05-12 DIAGNOSIS — N8110 Cystocele, unspecified: Secondary | ICD-10-CM | POA: Diagnosis not present

## 2018-05-12 DIAGNOSIS — G4700 Insomnia, unspecified: Secondary | ICD-10-CM | POA: Diagnosis not present

## 2018-05-12 DIAGNOSIS — F0281 Dementia in other diseases classified elsewhere with behavioral disturbance: Secondary | ICD-10-CM | POA: Diagnosis not present

## 2018-05-12 DIAGNOSIS — G300 Alzheimer's disease with early onset: Secondary | ICD-10-CM | POA: Diagnosis not present

## 2018-05-12 DIAGNOSIS — F419 Anxiety disorder, unspecified: Secondary | ICD-10-CM | POA: Diagnosis not present

## 2018-05-13 DIAGNOSIS — G4700 Insomnia, unspecified: Secondary | ICD-10-CM | POA: Diagnosis not present

## 2018-05-13 DIAGNOSIS — F419 Anxiety disorder, unspecified: Secondary | ICD-10-CM | POA: Diagnosis not present

## 2018-05-13 DIAGNOSIS — N8110 Cystocele, unspecified: Secondary | ICD-10-CM | POA: Diagnosis not present

## 2018-05-13 DIAGNOSIS — L03115 Cellulitis of right lower limb: Secondary | ICD-10-CM | POA: Diagnosis not present

## 2018-05-13 DIAGNOSIS — G300 Alzheimer's disease with early onset: Secondary | ICD-10-CM | POA: Diagnosis not present

## 2018-05-13 DIAGNOSIS — F0281 Dementia in other diseases classified elsewhere with behavioral disturbance: Secondary | ICD-10-CM | POA: Diagnosis not present

## 2018-05-15 DIAGNOSIS — N8110 Cystocele, unspecified: Secondary | ICD-10-CM | POA: Diagnosis not present

## 2018-05-15 DIAGNOSIS — F419 Anxiety disorder, unspecified: Secondary | ICD-10-CM | POA: Diagnosis not present

## 2018-05-15 DIAGNOSIS — G300 Alzheimer's disease with early onset: Secondary | ICD-10-CM | POA: Diagnosis not present

## 2018-05-15 DIAGNOSIS — F0281 Dementia in other diseases classified elsewhere with behavioral disturbance: Secondary | ICD-10-CM | POA: Diagnosis not present

## 2018-05-15 DIAGNOSIS — L03115 Cellulitis of right lower limb: Secondary | ICD-10-CM | POA: Diagnosis not present

## 2018-05-15 DIAGNOSIS — G4700 Insomnia, unspecified: Secondary | ICD-10-CM | POA: Diagnosis not present

## 2018-05-18 DIAGNOSIS — G4700 Insomnia, unspecified: Secondary | ICD-10-CM | POA: Diagnosis not present

## 2018-05-18 DIAGNOSIS — L03115 Cellulitis of right lower limb: Secondary | ICD-10-CM | POA: Diagnosis not present

## 2018-05-18 DIAGNOSIS — F419 Anxiety disorder, unspecified: Secondary | ICD-10-CM | POA: Diagnosis not present

## 2018-05-18 DIAGNOSIS — F0281 Dementia in other diseases classified elsewhere with behavioral disturbance: Secondary | ICD-10-CM | POA: Diagnosis not present

## 2018-05-18 DIAGNOSIS — G300 Alzheimer's disease with early onset: Secondary | ICD-10-CM | POA: Diagnosis not present

## 2018-05-18 DIAGNOSIS — N8110 Cystocele, unspecified: Secondary | ICD-10-CM | POA: Diagnosis not present

## 2018-05-20 DIAGNOSIS — L03115 Cellulitis of right lower limb: Secondary | ICD-10-CM | POA: Diagnosis not present

## 2018-05-20 DIAGNOSIS — G4700 Insomnia, unspecified: Secondary | ICD-10-CM | POA: Diagnosis not present

## 2018-05-20 DIAGNOSIS — F419 Anxiety disorder, unspecified: Secondary | ICD-10-CM | POA: Diagnosis not present

## 2018-05-20 DIAGNOSIS — G300 Alzheimer's disease with early onset: Secondary | ICD-10-CM | POA: Diagnosis not present

## 2018-05-20 DIAGNOSIS — F0281 Dementia in other diseases classified elsewhere with behavioral disturbance: Secondary | ICD-10-CM | POA: Diagnosis not present

## 2018-05-20 DIAGNOSIS — N8110 Cystocele, unspecified: Secondary | ICD-10-CM | POA: Diagnosis not present

## 2018-05-22 DIAGNOSIS — F419 Anxiety disorder, unspecified: Secondary | ICD-10-CM | POA: Diagnosis not present

## 2018-05-22 DIAGNOSIS — L03115 Cellulitis of right lower limb: Secondary | ICD-10-CM | POA: Diagnosis not present

## 2018-05-22 DIAGNOSIS — G300 Alzheimer's disease with early onset: Secondary | ICD-10-CM | POA: Diagnosis not present

## 2018-05-22 DIAGNOSIS — N8110 Cystocele, unspecified: Secondary | ICD-10-CM | POA: Diagnosis not present

## 2018-05-22 DIAGNOSIS — F0281 Dementia in other diseases classified elsewhere with behavioral disturbance: Secondary | ICD-10-CM | POA: Diagnosis not present

## 2018-05-22 DIAGNOSIS — G4700 Insomnia, unspecified: Secondary | ICD-10-CM | POA: Diagnosis not present

## 2018-05-25 DIAGNOSIS — G300 Alzheimer's disease with early onset: Secondary | ICD-10-CM | POA: Diagnosis not present

## 2018-05-25 DIAGNOSIS — N8110 Cystocele, unspecified: Secondary | ICD-10-CM | POA: Diagnosis not present

## 2018-05-25 DIAGNOSIS — F0281 Dementia in other diseases classified elsewhere with behavioral disturbance: Secondary | ICD-10-CM | POA: Diagnosis not present

## 2018-05-25 DIAGNOSIS — G4700 Insomnia, unspecified: Secondary | ICD-10-CM | POA: Diagnosis not present

## 2018-05-25 DIAGNOSIS — F419 Anxiety disorder, unspecified: Secondary | ICD-10-CM | POA: Diagnosis not present

## 2018-05-25 DIAGNOSIS — L03115 Cellulitis of right lower limb: Secondary | ICD-10-CM | POA: Diagnosis not present

## 2018-05-28 DIAGNOSIS — L03115 Cellulitis of right lower limb: Secondary | ICD-10-CM | POA: Diagnosis not present

## 2018-05-28 DIAGNOSIS — N8110 Cystocele, unspecified: Secondary | ICD-10-CM | POA: Diagnosis not present

## 2018-05-28 DIAGNOSIS — G4700 Insomnia, unspecified: Secondary | ICD-10-CM | POA: Diagnosis not present

## 2018-05-28 DIAGNOSIS — F419 Anxiety disorder, unspecified: Secondary | ICD-10-CM | POA: Diagnosis not present

## 2018-05-28 DIAGNOSIS — G300 Alzheimer's disease with early onset: Secondary | ICD-10-CM | POA: Diagnosis not present

## 2018-05-28 DIAGNOSIS — F0281 Dementia in other diseases classified elsewhere with behavioral disturbance: Secondary | ICD-10-CM | POA: Diagnosis not present

## 2018-05-29 DIAGNOSIS — G300 Alzheimer's disease with early onset: Secondary | ICD-10-CM | POA: Diagnosis not present

## 2018-05-29 DIAGNOSIS — L03115 Cellulitis of right lower limb: Secondary | ICD-10-CM | POA: Diagnosis not present

## 2018-05-29 DIAGNOSIS — F419 Anxiety disorder, unspecified: Secondary | ICD-10-CM | POA: Diagnosis not present

## 2018-05-29 DIAGNOSIS — N8110 Cystocele, unspecified: Secondary | ICD-10-CM | POA: Diagnosis not present

## 2018-05-29 DIAGNOSIS — F0281 Dementia in other diseases classified elsewhere with behavioral disturbance: Secondary | ICD-10-CM | POA: Diagnosis not present

## 2018-05-29 DIAGNOSIS — G4700 Insomnia, unspecified: Secondary | ICD-10-CM | POA: Diagnosis not present

## 2018-06-01 DIAGNOSIS — N8110 Cystocele, unspecified: Secondary | ICD-10-CM | POA: Diagnosis not present

## 2018-06-01 DIAGNOSIS — G300 Alzheimer's disease with early onset: Secondary | ICD-10-CM | POA: Diagnosis not present

## 2018-06-01 DIAGNOSIS — L03115 Cellulitis of right lower limb: Secondary | ICD-10-CM | POA: Diagnosis not present

## 2018-06-01 DIAGNOSIS — G4700 Insomnia, unspecified: Secondary | ICD-10-CM | POA: Diagnosis not present

## 2018-06-01 DIAGNOSIS — F0281 Dementia in other diseases classified elsewhere with behavioral disturbance: Secondary | ICD-10-CM | POA: Diagnosis not present

## 2018-06-01 DIAGNOSIS — F419 Anxiety disorder, unspecified: Secondary | ICD-10-CM | POA: Diagnosis not present

## 2018-06-02 DIAGNOSIS — L03115 Cellulitis of right lower limb: Secondary | ICD-10-CM | POA: Diagnosis not present

## 2018-06-02 DIAGNOSIS — G300 Alzheimer's disease with early onset: Secondary | ICD-10-CM | POA: Diagnosis not present

## 2018-06-02 DIAGNOSIS — G4700 Insomnia, unspecified: Secondary | ICD-10-CM | POA: Diagnosis not present

## 2018-06-02 DIAGNOSIS — F0281 Dementia in other diseases classified elsewhere with behavioral disturbance: Secondary | ICD-10-CM | POA: Diagnosis not present

## 2018-06-02 DIAGNOSIS — F419 Anxiety disorder, unspecified: Secondary | ICD-10-CM | POA: Diagnosis not present

## 2018-06-02 DIAGNOSIS — N8110 Cystocele, unspecified: Secondary | ICD-10-CM | POA: Diagnosis not present

## 2018-06-03 DIAGNOSIS — M069 Rheumatoid arthritis, unspecified: Secondary | ICD-10-CM | POA: Diagnosis not present

## 2018-06-03 DIAGNOSIS — L509 Urticaria, unspecified: Secondary | ICD-10-CM | POA: Diagnosis not present

## 2018-06-03 DIAGNOSIS — E039 Hypothyroidism, unspecified: Secondary | ICD-10-CM | POA: Diagnosis not present

## 2018-06-03 DIAGNOSIS — L03115 Cellulitis of right lower limb: Secondary | ICD-10-CM | POA: Diagnosis not present

## 2018-06-03 DIAGNOSIS — Z960 Presence of urogenital implants: Secondary | ICD-10-CM | POA: Diagnosis not present

## 2018-06-03 DIAGNOSIS — M810 Age-related osteoporosis without current pathological fracture: Secondary | ICD-10-CM | POA: Diagnosis not present

## 2018-06-03 DIAGNOSIS — M21611 Bunion of right foot: Secondary | ICD-10-CM | POA: Diagnosis not present

## 2018-06-03 DIAGNOSIS — I10 Essential (primary) hypertension: Secondary | ICD-10-CM | POA: Diagnosis not present

## 2018-06-03 DIAGNOSIS — F419 Anxiety disorder, unspecified: Secondary | ICD-10-CM | POA: Diagnosis not present

## 2018-06-03 DIAGNOSIS — E876 Hypokalemia: Secondary | ICD-10-CM | POA: Diagnosis not present

## 2018-06-03 DIAGNOSIS — G4700 Insomnia, unspecified: Secondary | ICD-10-CM | POA: Diagnosis not present

## 2018-06-03 DIAGNOSIS — E538 Deficiency of other specified B group vitamins: Secondary | ICD-10-CM | POA: Diagnosis not present

## 2018-06-03 DIAGNOSIS — F0281 Dementia in other diseases classified elsewhere with behavioral disturbance: Secondary | ICD-10-CM | POA: Diagnosis not present

## 2018-06-03 DIAGNOSIS — G300 Alzheimer's disease with early onset: Secondary | ICD-10-CM | POA: Diagnosis not present

## 2018-06-03 DIAGNOSIS — N8110 Cystocele, unspecified: Secondary | ICD-10-CM | POA: Diagnosis not present

## 2018-06-04 DIAGNOSIS — N8110 Cystocele, unspecified: Secondary | ICD-10-CM | POA: Diagnosis not present

## 2018-06-04 DIAGNOSIS — F419 Anxiety disorder, unspecified: Secondary | ICD-10-CM | POA: Diagnosis not present

## 2018-06-04 DIAGNOSIS — F0281 Dementia in other diseases classified elsewhere with behavioral disturbance: Secondary | ICD-10-CM | POA: Diagnosis not present

## 2018-06-04 DIAGNOSIS — L03115 Cellulitis of right lower limb: Secondary | ICD-10-CM | POA: Diagnosis not present

## 2018-06-04 DIAGNOSIS — G300 Alzheimer's disease with early onset: Secondary | ICD-10-CM | POA: Diagnosis not present

## 2018-06-04 DIAGNOSIS — G4700 Insomnia, unspecified: Secondary | ICD-10-CM | POA: Diagnosis not present

## 2018-06-08 DIAGNOSIS — F419 Anxiety disorder, unspecified: Secondary | ICD-10-CM | POA: Diagnosis not present

## 2018-06-08 DIAGNOSIS — G4700 Insomnia, unspecified: Secondary | ICD-10-CM | POA: Diagnosis not present

## 2018-06-08 DIAGNOSIS — N8110 Cystocele, unspecified: Secondary | ICD-10-CM | POA: Diagnosis not present

## 2018-06-08 DIAGNOSIS — F0281 Dementia in other diseases classified elsewhere with behavioral disturbance: Secondary | ICD-10-CM | POA: Diagnosis not present

## 2018-06-08 DIAGNOSIS — G300 Alzheimer's disease with early onset: Secondary | ICD-10-CM | POA: Diagnosis not present

## 2018-06-08 DIAGNOSIS — L03115 Cellulitis of right lower limb: Secondary | ICD-10-CM | POA: Diagnosis not present

## 2018-06-09 DIAGNOSIS — G300 Alzheimer's disease with early onset: Secondary | ICD-10-CM | POA: Diagnosis not present

## 2018-06-09 DIAGNOSIS — F419 Anxiety disorder, unspecified: Secondary | ICD-10-CM | POA: Diagnosis not present

## 2018-06-09 DIAGNOSIS — L03115 Cellulitis of right lower limb: Secondary | ICD-10-CM | POA: Diagnosis not present

## 2018-06-09 DIAGNOSIS — N8110 Cystocele, unspecified: Secondary | ICD-10-CM | POA: Diagnosis not present

## 2018-06-09 DIAGNOSIS — G4700 Insomnia, unspecified: Secondary | ICD-10-CM | POA: Diagnosis not present

## 2018-06-09 DIAGNOSIS — F0281 Dementia in other diseases classified elsewhere with behavioral disturbance: Secondary | ICD-10-CM | POA: Diagnosis not present

## 2018-06-10 DIAGNOSIS — G300 Alzheimer's disease with early onset: Secondary | ICD-10-CM | POA: Diagnosis not present

## 2018-06-10 DIAGNOSIS — G4700 Insomnia, unspecified: Secondary | ICD-10-CM | POA: Diagnosis not present

## 2018-06-10 DIAGNOSIS — F419 Anxiety disorder, unspecified: Secondary | ICD-10-CM | POA: Diagnosis not present

## 2018-06-10 DIAGNOSIS — N8110 Cystocele, unspecified: Secondary | ICD-10-CM | POA: Diagnosis not present

## 2018-06-10 DIAGNOSIS — L03115 Cellulitis of right lower limb: Secondary | ICD-10-CM | POA: Diagnosis not present

## 2018-06-10 DIAGNOSIS — F0281 Dementia in other diseases classified elsewhere with behavioral disturbance: Secondary | ICD-10-CM | POA: Diagnosis not present

## 2018-06-12 DIAGNOSIS — F419 Anxiety disorder, unspecified: Secondary | ICD-10-CM | POA: Diagnosis not present

## 2018-06-12 DIAGNOSIS — G300 Alzheimer's disease with early onset: Secondary | ICD-10-CM | POA: Diagnosis not present

## 2018-06-12 DIAGNOSIS — F0281 Dementia in other diseases classified elsewhere with behavioral disturbance: Secondary | ICD-10-CM | POA: Diagnosis not present

## 2018-06-12 DIAGNOSIS — L03115 Cellulitis of right lower limb: Secondary | ICD-10-CM | POA: Diagnosis not present

## 2018-06-12 DIAGNOSIS — G4700 Insomnia, unspecified: Secondary | ICD-10-CM | POA: Diagnosis not present

## 2018-06-12 DIAGNOSIS — N8110 Cystocele, unspecified: Secondary | ICD-10-CM | POA: Diagnosis not present

## 2018-06-15 DIAGNOSIS — F419 Anxiety disorder, unspecified: Secondary | ICD-10-CM | POA: Diagnosis not present

## 2018-06-15 DIAGNOSIS — N8110 Cystocele, unspecified: Secondary | ICD-10-CM | POA: Diagnosis not present

## 2018-06-15 DIAGNOSIS — G300 Alzheimer's disease with early onset: Secondary | ICD-10-CM | POA: Diagnosis not present

## 2018-06-15 DIAGNOSIS — G4700 Insomnia, unspecified: Secondary | ICD-10-CM | POA: Diagnosis not present

## 2018-06-15 DIAGNOSIS — L03115 Cellulitis of right lower limb: Secondary | ICD-10-CM | POA: Diagnosis not present

## 2018-06-15 DIAGNOSIS — F0281 Dementia in other diseases classified elsewhere with behavioral disturbance: Secondary | ICD-10-CM | POA: Diagnosis not present

## 2018-06-16 DIAGNOSIS — G4700 Insomnia, unspecified: Secondary | ICD-10-CM | POA: Diagnosis not present

## 2018-06-16 DIAGNOSIS — G300 Alzheimer's disease with early onset: Secondary | ICD-10-CM | POA: Diagnosis not present

## 2018-06-16 DIAGNOSIS — F419 Anxiety disorder, unspecified: Secondary | ICD-10-CM | POA: Diagnosis not present

## 2018-06-16 DIAGNOSIS — F0281 Dementia in other diseases classified elsewhere with behavioral disturbance: Secondary | ICD-10-CM | POA: Diagnosis not present

## 2018-06-16 DIAGNOSIS — N8110 Cystocele, unspecified: Secondary | ICD-10-CM | POA: Diagnosis not present

## 2018-06-16 DIAGNOSIS — L03115 Cellulitis of right lower limb: Secondary | ICD-10-CM | POA: Diagnosis not present

## 2018-06-17 DIAGNOSIS — L03115 Cellulitis of right lower limb: Secondary | ICD-10-CM | POA: Diagnosis not present

## 2018-06-17 DIAGNOSIS — F419 Anxiety disorder, unspecified: Secondary | ICD-10-CM | POA: Diagnosis not present

## 2018-06-17 DIAGNOSIS — G4700 Insomnia, unspecified: Secondary | ICD-10-CM | POA: Diagnosis not present

## 2018-06-17 DIAGNOSIS — F0281 Dementia in other diseases classified elsewhere with behavioral disturbance: Secondary | ICD-10-CM | POA: Diagnosis not present

## 2018-06-17 DIAGNOSIS — G300 Alzheimer's disease with early onset: Secondary | ICD-10-CM | POA: Diagnosis not present

## 2018-06-17 DIAGNOSIS — N8110 Cystocele, unspecified: Secondary | ICD-10-CM | POA: Diagnosis not present

## 2018-06-18 DIAGNOSIS — F419 Anxiety disorder, unspecified: Secondary | ICD-10-CM | POA: Diagnosis not present

## 2018-06-18 DIAGNOSIS — F0281 Dementia in other diseases classified elsewhere with behavioral disturbance: Secondary | ICD-10-CM | POA: Diagnosis not present

## 2018-06-18 DIAGNOSIS — G4700 Insomnia, unspecified: Secondary | ICD-10-CM | POA: Diagnosis not present

## 2018-06-18 DIAGNOSIS — N8110 Cystocele, unspecified: Secondary | ICD-10-CM | POA: Diagnosis not present

## 2018-06-18 DIAGNOSIS — G300 Alzheimer's disease with early onset: Secondary | ICD-10-CM | POA: Diagnosis not present

## 2018-06-18 DIAGNOSIS — L03115 Cellulitis of right lower limb: Secondary | ICD-10-CM | POA: Diagnosis not present

## 2018-06-19 DIAGNOSIS — G4700 Insomnia, unspecified: Secondary | ICD-10-CM | POA: Diagnosis not present

## 2018-06-19 DIAGNOSIS — F419 Anxiety disorder, unspecified: Secondary | ICD-10-CM | POA: Diagnosis not present

## 2018-06-19 DIAGNOSIS — L03115 Cellulitis of right lower limb: Secondary | ICD-10-CM | POA: Diagnosis not present

## 2018-06-19 DIAGNOSIS — N8110 Cystocele, unspecified: Secondary | ICD-10-CM | POA: Diagnosis not present

## 2018-06-19 DIAGNOSIS — G300 Alzheimer's disease with early onset: Secondary | ICD-10-CM | POA: Diagnosis not present

## 2018-06-19 DIAGNOSIS — F0281 Dementia in other diseases classified elsewhere with behavioral disturbance: Secondary | ICD-10-CM | POA: Diagnosis not present

## 2018-06-20 DIAGNOSIS — N8110 Cystocele, unspecified: Secondary | ICD-10-CM | POA: Diagnosis not present

## 2018-06-20 DIAGNOSIS — F0281 Dementia in other diseases classified elsewhere with behavioral disturbance: Secondary | ICD-10-CM | POA: Diagnosis not present

## 2018-06-20 DIAGNOSIS — G4700 Insomnia, unspecified: Secondary | ICD-10-CM | POA: Diagnosis not present

## 2018-06-20 DIAGNOSIS — G300 Alzheimer's disease with early onset: Secondary | ICD-10-CM | POA: Diagnosis not present

## 2018-06-20 DIAGNOSIS — L03115 Cellulitis of right lower limb: Secondary | ICD-10-CM | POA: Diagnosis not present

## 2018-06-20 DIAGNOSIS — F419 Anxiety disorder, unspecified: Secondary | ICD-10-CM | POA: Diagnosis not present

## 2018-07-04 DEATH — deceased

## 2018-11-29 IMAGING — DX DG CHEST 1V PORT
1 series · 1 of 1 positions shown · non-contrast
Comparison: 12/02/2014

CLINICAL DATA: Fever and wheezing

EXAM:
PORTABLE CHEST 1 VIEW

[chest ap]
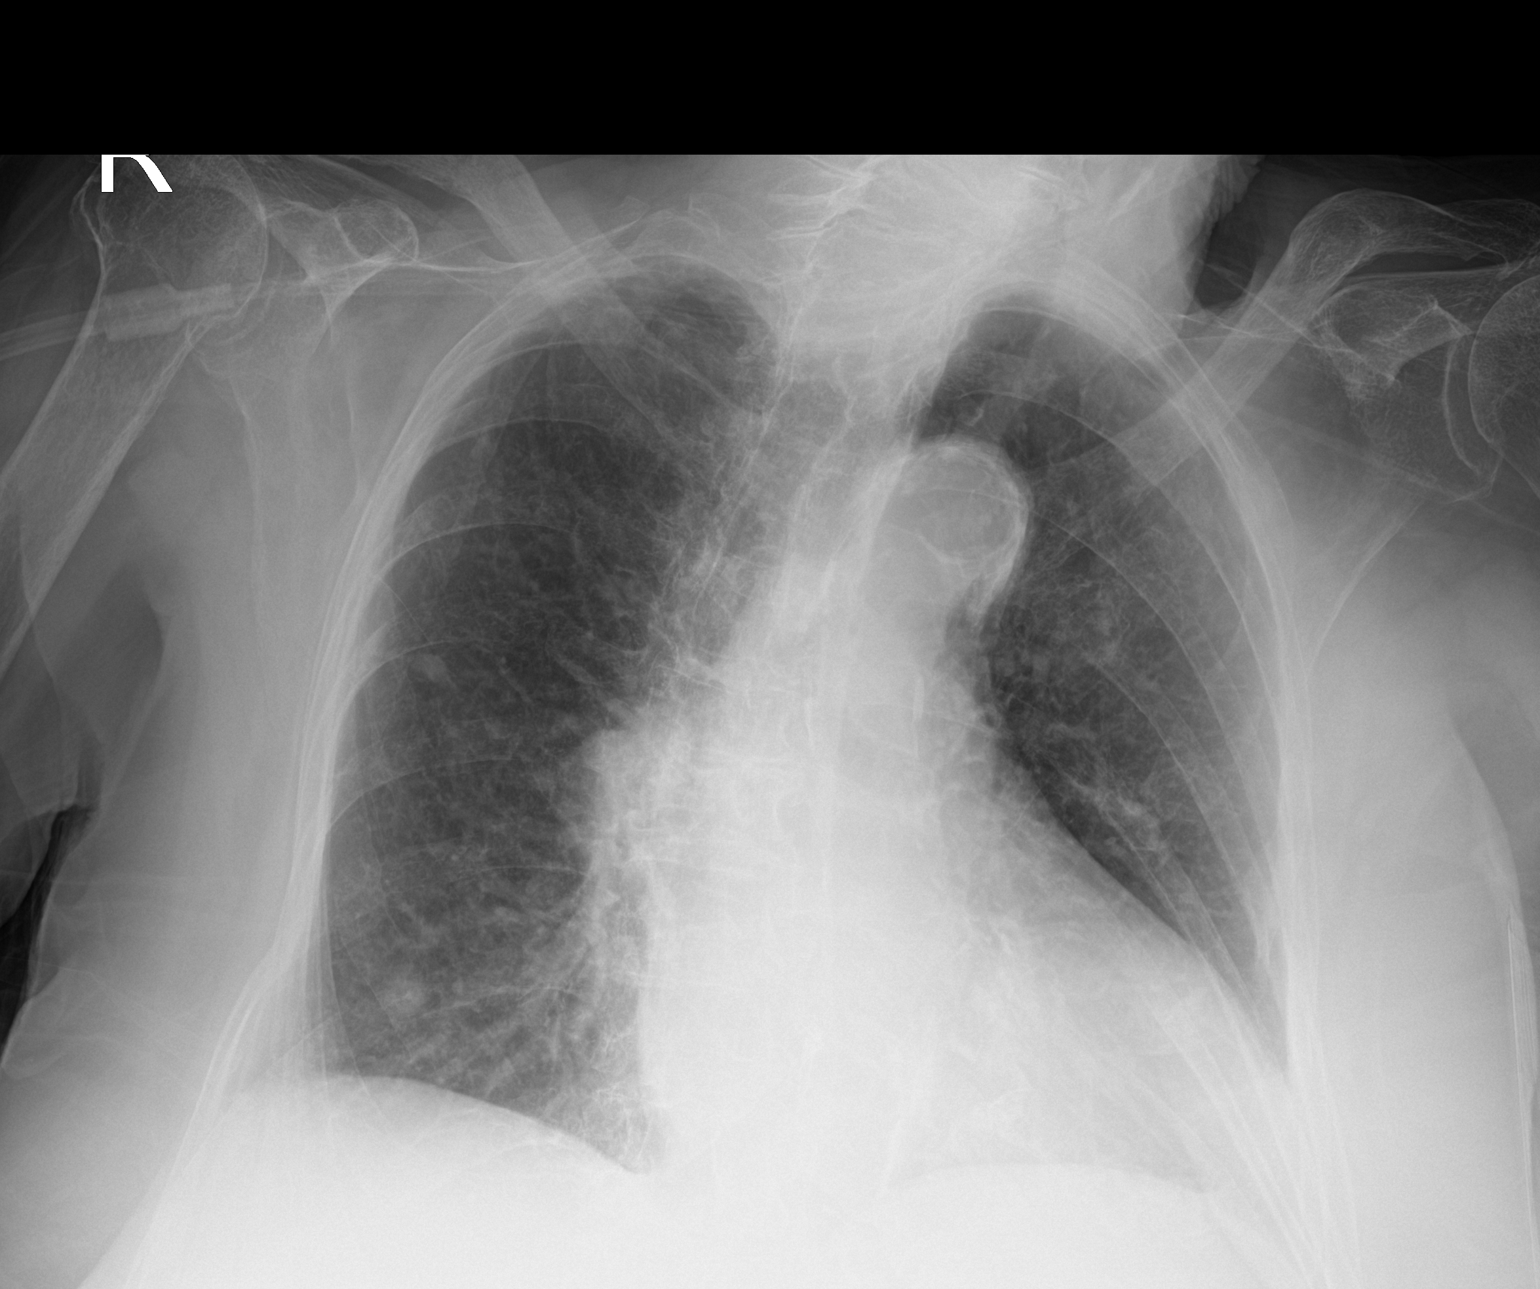

[1 of 1 positions shown; findings below may reference images not displayed]

FINDINGS: 1 cm nodular density right lung base slightly increased in size from
the prior study.

Subcentimeter nodular density right mid lung appears slightly
increased from the prior study.

Cardiac enlargement without heart failure. Negative for pneumonia or
effusion. Atherosclerotic aorta
IMPRESSION: Right lung nodules, appearing slightly larger than the study on
12/02/2014. CT chest recommended for further evaluation.
